# Patient Record
Sex: Female | Born: 1955
Health system: Southern US, Community
[De-identification: ages and names within clinical notes are randomized; demographics above are authoritative.]

## PROBLEM LIST (undated history)

## (undated) DIAGNOSIS — Z9889 Other specified postprocedural states: Secondary | ICD-10-CM

## (undated) DIAGNOSIS — M1990 Unspecified osteoarthritis, unspecified site: Secondary | ICD-10-CM

## (undated) DIAGNOSIS — I10 Essential (primary) hypertension: Secondary | ICD-10-CM

## (undated) DIAGNOSIS — Z8719 Personal history of other diseases of the digestive system: Secondary | ICD-10-CM

## (undated) DIAGNOSIS — R112 Nausea with vomiting, unspecified: Secondary | ICD-10-CM

## (undated) HISTORY — PX: CHOLECYSTECTOMY: SHX55

## (undated) HISTORY — PX: COLONOSCOPY: SHX174

## (undated) HISTORY — PX: DILATION AND CURETTAGE OF UTERUS: SHX78

---

## 2016-01-26 MED FILL — DICLOFENAC SOD 75 MG TAB EC: 75 | 90 days supply | Qty: 180 | Fill #0

## 2016-02-08 MED FILL — metFORMIN HCL 500 MG TABS: 500 | 90 days supply | Qty: 180 | Fill #0

## 2016-02-08 MED FILL — LOSARTAN POTASSIUM 50 MG TA: 50 | 90 days supply | Qty: 180 | Fill #0

## 2016-02-10 DIAGNOSIS — I10 Essential (primary) hypertension: Secondary | ICD-10-CM | POA: Diagnosis not present

## 2016-04-30 MED FILL — metFORMIN HCL 500 MG TABS: 500 | 90 days supply | Qty: 180 | Fill #1

## 2016-04-30 MED FILL — LOSARTAN POTASSIUM 50 MG TA: 50 | 90 days supply | Qty: 180 | Fill #1

## 2016-07-23 MED FILL — metFORMIN HCL 500 MG TABS: 500 | 60 days supply | Qty: 120 | Fill #2

## 2016-07-23 MED FILL — LOSARTAN POTASSIUM 50 MG TA: 50 | 90 days supply | Qty: 180 | Fill #2

## 2016-10-15 MED FILL — DICLOFENAC SOD 75 MG TAB EC: 75 | 90 days supply | Qty: 180 | Fill #1

## 2016-10-15 MED FILL — LOSARTAN POTASSIUM 50 MG TA: 50 | 60 days supply | Qty: 120 | Fill #3

## 2016-12-31 DIAGNOSIS — Z1231 Encounter for screening mammogram for malignant neoplasm of breast: Secondary | ICD-10-CM | POA: Diagnosis not present

## 2017-01-11 DIAGNOSIS — Z6827 Body mass index (BMI) 27.0-27.9, adult: Secondary | ICD-10-CM | POA: Diagnosis not present

## 2017-01-11 DIAGNOSIS — M7070 Other bursitis of hip, unspecified hip: Secondary | ICD-10-CM | POA: Diagnosis not present

## 2017-01-11 DIAGNOSIS — L678 Other hair color and hair shaft abnormalities: Secondary | ICD-10-CM | POA: Diagnosis not present

## 2017-01-11 DIAGNOSIS — I10 Essential (primary) hypertension: Secondary | ICD-10-CM | POA: Diagnosis not present

## 2017-01-11 MED FILL — DICLOFENAC SODIUM 75 MG TAB: 75 | 90 days supply | Qty: 180 | Fill #0

## 2017-01-11 MED FILL — LOSARTAN POTASSIUM 50 MG TA: 50 | 90 days supply | Qty: 180 | Fill #0

## 2017-01-11 MED FILL — DICLOFENAC SODIUM 1% GEL: 1 | 12 days supply | Qty: 100 | Fill #0

## 2017-01-11 MED FILL — metFORMIN HCL 500 MG TABS: 500 | 90 days supply | Qty: 180 | Fill #0

## 2017-01-16 DIAGNOSIS — R928 Other abnormal and inconclusive findings on diagnostic imaging of breast: Secondary | ICD-10-CM | POA: Diagnosis not present

## 2017-01-16 DIAGNOSIS — R922 Inconclusive mammogram: Secondary | ICD-10-CM | POA: Diagnosis not present

## 2017-01-16 DIAGNOSIS — Z803 Family history of malignant neoplasm of breast: Secondary | ICD-10-CM | POA: Diagnosis not present

## 2017-01-18 DIAGNOSIS — I10 Essential (primary) hypertension: Secondary | ICD-10-CM | POA: Diagnosis not present

## 2017-01-21 DIAGNOSIS — R928 Other abnormal and inconclusive findings on diagnostic imaging of breast: Secondary | ICD-10-CM | POA: Diagnosis not present

## 2017-01-21 DIAGNOSIS — Z6827 Body mass index (BMI) 27.0-27.9, adult: Secondary | ICD-10-CM | POA: Diagnosis not present

## 2017-01-28 DIAGNOSIS — N6321 Unspecified lump in the left breast, upper outer quadrant: Secondary | ICD-10-CM | POA: Diagnosis not present

## 2017-01-28 DIAGNOSIS — R928 Other abnormal and inconclusive findings on diagnostic imaging of breast: Secondary | ICD-10-CM | POA: Diagnosis not present

## 2017-04-05 MED FILL — LOSARTAN POTASSIUM 50 MG TA: 50 | 90 days supply | Qty: 180 | Fill #1

## 2017-04-05 MED FILL — metFORMIN HCL 500 MG TABS: 500 | 90 days supply | Qty: 180 | Fill #1

## 2017-05-20 MED FILL — DICLOFENAC SODIUM 1% GEL: 1 | 12 days supply | Qty: 100 | Fill #1

## 2017-06-04 DIAGNOSIS — Z6828 Body mass index (BMI) 28.0-28.9, adult: Secondary | ICD-10-CM | POA: Diagnosis not present

## 2017-06-04 DIAGNOSIS — I10 Essential (primary) hypertension: Secondary | ICD-10-CM | POA: Diagnosis not present

## 2017-06-04 DIAGNOSIS — E7800 Pure hypercholesterolemia, unspecified: Secondary | ICD-10-CM | POA: Diagnosis not present

## 2017-06-04 DIAGNOSIS — L678 Other hair color and hair shaft abnormalities: Secondary | ICD-10-CM | POA: Diagnosis not present

## 2017-06-04 DIAGNOSIS — Z0000 Encounter for general adult medical examination without abnormal findings: Secondary | ICD-10-CM | POA: Diagnosis not present

## 2017-06-04 MED FILL — EZETIMIBE 10 MG TABLET: 10 | 90 days supply | Qty: 90 | Fill #0

## 2017-07-09 MED FILL — LOSARTAN POTASSIUM 50 MG TA: 50 | 90 days supply | Qty: 180 | Fill #2

## 2017-08-20 MED FILL — AZITHROMYCIN 250 MG TABLET: 250 | 5 days supply | Qty: 6 | Fill #0

## 2017-08-23 MED FILL — metFORMIN HCL 500 MG TABS: 500 | 90 days supply | Qty: 180 | Fill #2

## 2017-08-23 MED FILL — EZETIMIBE 10 MG TABLET: 10 | 90 days supply | Qty: 90 | Fill #1

## 2017-09-30 MED FILL — LOSARTAN POTASSIUM 50 MG TA: 50 | 90 days supply | Qty: 180 | Fill #3

## 2017-10-04 DIAGNOSIS — Z0000 Encounter for general adult medical examination without abnormal findings: Secondary | ICD-10-CM | POA: Diagnosis not present

## 2017-11-11 MED FILL — EZETIMIBE 10 MG TABLET: 10 | 90 days supply | Qty: 90 | Fill #2

## 2017-12-30 MED FILL — metFORMIN HCL 500 MG TABS: 500 | 90 days supply | Qty: 180 | Fill #3

## 2017-12-30 MED FILL — LOSARTAN POTASSIUM 50 MG TA: 50 | 90 days supply | Qty: 180 | Fill #0

## 2018-01-06 DIAGNOSIS — Z1231 Encounter for screening mammogram for malignant neoplasm of breast: Secondary | ICD-10-CM | POA: Diagnosis not present

## 2018-02-12 MED FILL — EZETIMIBE 10 MG TABS: 10 | 90 days supply | Qty: 90 | Fill #3

## 2018-03-03 MED FILL — GAVILYTE-G SOLUTION: 236 | 1 days supply | Qty: 4000 | Fill #0

## 2018-03-19 MED FILL — DULoxetine HCL 20 MG CPEP: 20 | 90 days supply | Qty: 90 | Fill #0

## 2018-04-04 DIAGNOSIS — E785 Hyperlipidemia, unspecified: Secondary | ICD-10-CM | POA: Diagnosis not present

## 2018-04-04 DIAGNOSIS — K648 Other hemorrhoids: Secondary | ICD-10-CM | POA: Diagnosis not present

## 2018-04-04 DIAGNOSIS — D122 Benign neoplasm of ascending colon: Secondary | ICD-10-CM | POA: Diagnosis not present

## 2018-04-04 DIAGNOSIS — K635 Polyp of colon: Secondary | ICD-10-CM | POA: Diagnosis not present

## 2018-04-04 DIAGNOSIS — K5730 Diverticulosis of large intestine without perforation or abscess without bleeding: Secondary | ICD-10-CM | POA: Diagnosis not present

## 2018-04-04 DIAGNOSIS — Z1211 Encounter for screening for malignant neoplasm of colon: Secondary | ICD-10-CM | POA: Diagnosis not present

## 2018-04-04 DIAGNOSIS — Z87891 Personal history of nicotine dependence: Secondary | ICD-10-CM | POA: Diagnosis not present

## 2018-04-04 DIAGNOSIS — K449 Diaphragmatic hernia without obstruction or gangrene: Secondary | ICD-10-CM | POA: Diagnosis not present

## 2018-04-04 DIAGNOSIS — Z86010 Personal history of colonic polyps: Secondary | ICD-10-CM | POA: Diagnosis not present

## 2018-04-04 DIAGNOSIS — K219 Gastro-esophageal reflux disease without esophagitis: Secondary | ICD-10-CM | POA: Diagnosis not present

## 2018-04-28 MED FILL — LOSARTAN POTASSIUM 50 MG TA: 50 | 90 days supply | Qty: 180 | Fill #0

## 2018-05-01 MED FILL — metFORMIN HCL 500 MG TABS: 500 | 30 days supply | Qty: 60 | Fill #0

## 2018-05-22 MED FILL — DULOXETINE HCL 20 MG CPEP: 20 | 90 days supply | Qty: 180 | Fill #0

## 2018-05-22 MED FILL — metFORMIN HCL 500 MG TABS: 500 | 90 days supply | Qty: 180 | Fill #0

## 2018-05-22 MED FILL — DICLOFENAC SODIUM 1 % GEL: 1 | 12 days supply | Qty: 100 | Fill #0

## 2018-05-28 MED FILL — EZETIMIBE 10 MG TABS: 10 | 90 days supply | Qty: 90 | Fill #0

## 2018-06-16 DIAGNOSIS — I10 Essential (primary) hypertension: Secondary | ICD-10-CM | POA: Diagnosis not present

## 2018-06-16 DIAGNOSIS — E7800 Pure hypercholesterolemia, unspecified: Secondary | ICD-10-CM | POA: Diagnosis not present

## 2018-07-09 MED FILL — LOSARTAN POTASSIUM 50 MG TA: 50 | 30 days supply | Qty: 60 | Fill #0

## 2018-07-10 DIAGNOSIS — M160 Bilateral primary osteoarthritis of hip: Secondary | ICD-10-CM | POA: Diagnosis not present

## 2018-07-10 DIAGNOSIS — Z0000 Encounter for general adult medical examination without abnormal findings: Secondary | ICD-10-CM | POA: Diagnosis not present

## 2018-07-10 DIAGNOSIS — M7070 Other bursitis of hip, unspecified hip: Secondary | ICD-10-CM | POA: Diagnosis not present

## 2018-07-10 DIAGNOSIS — I10 Essential (primary) hypertension: Secondary | ICD-10-CM | POA: Diagnosis not present

## 2018-07-10 DIAGNOSIS — Z6827 Body mass index (BMI) 27.0-27.9, adult: Secondary | ICD-10-CM | POA: Diagnosis not present

## 2018-07-10 DIAGNOSIS — E7800 Pure hypercholesterolemia, unspecified: Secondary | ICD-10-CM | POA: Diagnosis not present

## 2018-07-10 DIAGNOSIS — L678 Other hair color and hair shaft abnormalities: Secondary | ICD-10-CM | POA: Diagnosis not present

## 2018-08-14 MED FILL — DULoxetine HCL 20 MG CPEP: 20 | 90 days supply | Qty: 180 | Fill #1

## 2018-08-14 MED FILL — metFORMIN HCL 500 MG TABS: 500 | 90 days supply | Qty: 180 | Fill #1

## 2018-08-14 MED FILL — LOSARTAN POTASSIUM 50 MG TA: 50 | 30 days supply | Qty: 60 | Fill #1

## 2018-08-14 MED FILL — EZETIMIBE 10 MG TABS: 10 | 90 days supply | Qty: 90 | Fill #0

## 2018-10-08 MED FILL — LOSARTAN POTASSIUM 50 MG TA: 50 | 30 days supply | Qty: 60 | Fill #2

## 2018-11-08 MED FILL — LOSARTAN POTASSIUM 50 MG TA: 50 | 30 days supply | Qty: 60 | Fill #3

## 2018-11-08 MED FILL — EZETIMIBE 10 MG TABS: 10 | 90 days supply | Qty: 90 | Fill #1

## 2018-11-10 ENCOUNTER — Ambulatory Visit (INDEPENDENT_AMBULATORY_CARE_PROVIDER_SITE_OTHER): Payer: 59

## 2018-11-10 ENCOUNTER — Ambulatory Visit (INDEPENDENT_AMBULATORY_CARE_PROVIDER_SITE_OTHER): Payer: 59 | Admitting: Orthopaedic Surgery

## 2018-11-10 VITALS — Ht 65.0 in | Wt 165.0 lb

## 2018-11-10 DIAGNOSIS — M25551 Pain in right hip: Secondary | ICD-10-CM | POA: Diagnosis not present

## 2018-11-10 DIAGNOSIS — M1611 Unilateral primary osteoarthritis, right hip: Secondary | ICD-10-CM | POA: Diagnosis not present

## 2018-11-10 NOTE — Progress Notes (Signed)
Office Visit Note   Patient: Alyssa Hopkins           Date of Birth: 27-Apr-1955           MRN: [id number] Visit Date: 11/10/2018              Requested by: No referring provider defined for this encounter. PCP: Kingsley Callander, MD   Assessment & Plan: Visit Diagnoses:  1. Pain in right hip   2. Unilateral primary osteoarthritis, right hip     Plan: At this point she is tried and exhausted all forms of conservative treatment.  1 option would be a hip replacement.  I agree that she needs this at some point when she feels ready given the severity of arthritis on x-rays and the pain she is experiencing on a daily basis.  Also on clinical exam her right hip is quite stiff.  I spent some time with her showing her hip model.  We went over x-rays and described in detail what the surgery involves from her interoperative and postoperative course.  We talked about the risk and benefits of surgery as well and I gave her handout about hip replacement surgery through an anterior approach.  All question concerns were answered and addressed.  She knows to come off anti-inflammatories about 6 to 7 days preoperative.  I gave her our surgery scheduler's card.  She said she will give [place] a call when she feels ready.  She is also welcome to message me through my chart.  Follow-Up Instructions: Return if symptoms worsen or fail to improve.   Orders:  Orders Placed This Encounter  Procedures   XR HIP UNILAT W OR W/O PELVIS 1V RIGHT   No orders of the defined types were placed in this encounter.     Procedures: No procedures performed   Clinical Data: No additional findings.   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  This is been slowly getting worse for several years now.  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  She also has a mother who has had a hip replacement and a brother who is had a hip replacement.  She does have groin pain.  She says she is able to do with it as long as she is on anti-inflammatories.  The pain is been slowly worsening with time.  It hurts with her actives daily living.  At this point her right hip pain is definitely affecting her mobility, her quality of life and actives daily living.  She works quite regularly as a Designer, jewellery.  She is not diabetic.  She has been dealing with this worsening for years and at this point wants to consider hip replacement surgery.  She has exhausted all forms of conservative treatment at this point.  HPI  Review of Systems She currently denies any headache, chest pain, shortness of breath, fever, chills, nausea, vomiting  Objective: Vital Signs: Ht 5\' 5"  (1.651 m)    Wt 165 lb (74.8 kg)    BMI 27.46 kg/m   Physical Exam She is alert and orient x3 and in no acute distress Ortho Exam Examination of her right hip shows pain with internal and external rotation.  There is stiffness as well.  Her left hip moves normally. Specialty Comments:  No specialty comments available.  Imaging: Xr Hip Unilat W Or W/o Pelvis 1v Right  Result Date: 11/10/2018 An AP pelvis and lateral right hip show severe end-stage arthritis of the right hip.  There is complete loss of the joint space.  There are sclerotic changes in the femoral head and the acetabulum.  There are large periarticular osteophytes around the hip.    PMFS History: Patient Active Problem List   Diagnosis Date Noted   Unilateral primary osteoarthritis, right hip 11/10/2018   No past medical history on file.  No family history on file.   Social History   Occupational History   Not on file  Tobacco Use   Smoking status: Not on file  Substance and Sexual Activity   Alcohol use: Not on file   Drug use: Not on file   Sexual activity: Not on file

## 2018-11-13 ENCOUNTER — Encounter: Payer: Self-pay | Admitting: Orthopaedic Surgery

## 2018-12-17 MED FILL — LOSARTAN POTASSIUM 50 MG TA: 50 | 30 days supply | Qty: 60 | Fill #4

## 2019-01-11 MED FILL — metFORMIN HCL 500 MG TABS: 500 | 90 days supply | Qty: 180 | Fill #2

## 2019-01-12 MED FILL — LOSARTAN POTASSIUM 50 MG TA: 50 | 30 days supply | Qty: 60 | Fill #5

## 2019-02-02 DIAGNOSIS — R921 Mammographic calcification found on diagnostic imaging of breast: Secondary | ICD-10-CM | POA: Diagnosis not present

## 2019-02-02 DIAGNOSIS — R922 Inconclusive mammogram: Secondary | ICD-10-CM | POA: Diagnosis not present

## 2019-02-02 DIAGNOSIS — R928 Other abnormal and inconclusive findings on diagnostic imaging of breast: Secondary | ICD-10-CM | POA: Diagnosis not present

## 2019-02-02 DIAGNOSIS — Z1231 Encounter for screening mammogram for malignant neoplasm of breast: Secondary | ICD-10-CM | POA: Diagnosis not present

## 2019-02-05 ENCOUNTER — Encounter: Payer: Self-pay | Admitting: Orthopaedic Surgery

## 2019-02-05 MED FILL — EZETIMIBE 10 MG TABS: 10 | 90 days supply | Qty: 90 | Fill #2

## 2019-02-07 MED FILL — LOSARTAN POTASSIUM 50 MG TA: 50 | 30 days supply | Qty: 60 | Fill #6

## 2019-02-12 ENCOUNTER — Encounter: Payer: Self-pay | Admitting: Orthopaedic Surgery

## 2019-03-05 ENCOUNTER — Encounter: Payer: Self-pay | Admitting: Orthopaedic Surgery

## 2019-03-06 ENCOUNTER — Telehealth: Payer: Self-pay

## 2019-03-06 ENCOUNTER — Other Ambulatory Visit: Payer: Self-pay

## 2019-03-06 NOTE — Telephone Encounter (Signed)
Faxed completed form to [employer] [phone] Also emailed copy to patient.

## 2019-03-10 ENCOUNTER — Other Ambulatory Visit: Payer: Self-pay | Admitting: Physician Assistant

## 2019-03-14 MED FILL — LOSARTAN POTASSIUM 50 MG TA: 50 | 30 days supply | Qty: 60 | Fill #7

## 2019-03-19 NOTE — Progress Notes (Addendum)
[place], [place] - [address] [place] [address] Phone: [phone] Fax: [phone]  [place], [place] - [address] [place] [address] Phone: [phone] Fax: [phone]  3   Your procedure is scheduled on Tuesday Mar 24, 2019.  Report to [facility] Main Entrance "A" at 10:00 A.M., and check in at the Admitting office.  Call this number if you have problems the morning of surgery:  [phone]   Call [phone] if you have any questions prior to your surgery date Monday-Friday 8am-4pm    Remember:  Do not eat after midnight the night before your surgery  You may drink clear liquids until 09:00 A.M the morning of your surgery.   Clear liquids allowed are: Water, Non-Citrus Juices (without pulp), Carbonated Beverages, Clear Tea, Black Coffee Only, and Gatorade  Please complete your PRE-SURGERY ENSURE that was provided to you by 09:00 A.M. the morning of surgery.  Please, if able, drink it in one setting. DO NOT SIP    Take these medicines the morning of surgery with A SIP OF WATER  acetaminophen (TYLENOL) ezetimibe (ZETIA)  7 days prior to surgery STOP taking any Aspirin (unless otherwise instructed by your surgeon), Aleve, Naproxen, Ibuprofen, Motrin, Advil, Goody's, BC's, all herbal medications, fish oil, and all vitamins.    The Morning of Surgery  Do not wear jewelry, make-up or nail polish.  Do not wear lotions, powders, perfumes, or deodorant  Do not shave 48 hours prior to surgery.   Do not bring valuables to the hospital.  [facility] is not responsible for any belongings or valuables.  If you are a smoker, DO NOT Smoke 24 hours prior to surgery  If you wear a CPAP at night please bring your mask the morning of surgery   Remember that you must have someone to transport you home after your surgery, and remain with you for  24 hours if you are discharged the same day.   Please bring cases for contacts, glasses, hearing aids, dentures or bridgework because it cannot be worn into surgery.    Leave your suitcase in the car.  After surgery it may be brought to your room.  For patients admitted to the hospital, discharge time will be determined by your treatment team.  Patients discharged the day of surgery will not be allowed to drive home.    Special instructions:   [facility]- Preparing For Surgery  Before surgery, you can play an important role. Because skin is not sterile, your skin needs to be as free of germs as possible. You can reduce the number of germs on your skin by washing with CHG (chlorahexidine gluconate) Soap before surgery.  CHG is an antiseptic cleaner which kills germs and bonds with the skin to continue killing germs even after washing.    Oral Hygiene is also important to reduce your risk of infection.  Remember - BRUSH YOUR TEETH THE MORNING OF SURGERY WITH YOUR REGULAR TOOTHPASTE  Please do not use if you have an allergy to CHG or antibacterial soaps. If your skin becomes reddened/irritated stop using the CHG.  Do not shave (including legs and underarms) for at least 48 hours prior to first CHG shower. It is OK to shave your face.  Please follow these instructions carefully.   1. Shower the NIGHT BEFORE SURGERY and the MORNING OF SURGERY with CHG Soap.  2. If you chose to wash your hair, wash your hair first as usual with your normal shampoo.  3. After you shampoo, rinse your hair and body thoroughly to remove the shampoo.  4. Use CHG as you would any other liquid soap. You can apply CHG directly to the skin and wash gently with a scrungie or a clean washcloth.   5. Apply the CHG Soap to your body ONLY FROM THE NECK DOWN.  Do not use on open wounds or open sores. Avoid contact with your eyes, ears, mouth and genitals (private parts). Wash Face and genitals (private parts)  with  your normal soap.   6. Wash thoroughly, paying special attention to the area where your surgery will be performed.  7. Thoroughly rinse your body with warm water from the neck down.  8. DO NOT shower/wash with your normal soap after using and rinsing off the CHG Soap.  9. Pat yourself dry with a CLEAN TOWEL.  10. Wear CLEAN PAJAMAS to bed the night before surgery, wear comfortable clothes the morning of surgery  11. Place CLEAN SHEETS on your bed the night of your first shower and DO NOT SLEEP WITH PETS.    Day of Surgery:  Please shower the morning of surgery with the CHG soap Do not apply any deodorants/lotions. Please wear clean clothes to the hospital/surgery center.   Remember to brush your teeth WITH YOUR REGULAR TOOTHPASTE.   Please read over the following fact sheets that you were given.

## 2019-03-20 ENCOUNTER — Other Ambulatory Visit: Payer: Self-pay

## 2019-03-20 ENCOUNTER — Encounter (HOSPITAL_COMMUNITY)
Admission: RE | Admit: 2019-03-20 | Discharge: 2019-03-20 | Disposition: A | Discharge status: Home / Self Care | Payer: 59 | Source: Ambulatory Visit | Attending: Orthopaedic Surgery | Admitting: Orthopaedic Surgery

## 2019-03-20 ENCOUNTER — Other Ambulatory Visit (HOSPITAL_COMMUNITY)
Admission: RE | Admit: 2019-03-20 | Discharge: 2019-03-20 | Disposition: A | Discharge status: Home / Self Care | Payer: 59 | Source: Ambulatory Visit | Attending: Orthopaedic Surgery | Admitting: Orthopaedic Surgery

## 2019-03-20 ENCOUNTER — Encounter (HOSPITAL_COMMUNITY): Payer: Self-pay

## 2019-03-20 DIAGNOSIS — Z20822 Contact with and (suspected) exposure to covid-19: Secondary | ICD-10-CM | POA: Diagnosis not present

## 2019-03-20 DIAGNOSIS — R9431 Abnormal electrocardiogram [ECG] [EKG]: Secondary | ICD-10-CM | POA: Insufficient documentation

## 2019-03-20 DIAGNOSIS — Z01812 Encounter for preprocedural laboratory examination: Secondary | ICD-10-CM | POA: Insufficient documentation

## 2019-03-20 HISTORY — DX: Other specified postprocedural states: Z98.890

## 2019-03-20 HISTORY — DX: Essential (primary) hypertension: I10

## 2019-03-20 HISTORY — DX: Other specified postprocedural states: R11.2

## 2019-03-20 HISTORY — DX: Personal history of other diseases of the digestive system: Z87.19

## 2019-03-20 HISTORY — DX: Unspecified osteoarthritis, unspecified site: M19.90

## 2019-03-20 LAB — CBC
HCT: 41.1 % (ref 36.0–46.0)
Hemoglobin: 13.4 g/dL (ref 12.0–15.0)
MCH: 29.6 pg (ref 26.0–34.0)
MCHC: 32.6 g/dL (ref 30.0–36.0)
MCV: 90.7 fL (ref 80.0–100.0)
Platelets: 247 10*3/uL (ref 150–400)
RBC: 4.53 MIL/uL (ref 3.87–5.11)
RDW: 13.2 % (ref 11.5–15.5)
WBC: 6.9 10*3/uL (ref 4.0–10.5)
nRBC: 0 % (ref 0.0–0.2)

## 2019-03-20 LAB — BASIC METABOLIC PANEL
Anion gap: 10 (ref 5–15)
BUN: 11 mg/dL (ref 8–23)
CO2: 26 mmol/L (ref 22–32)
Calcium: 10.2 mg/dL (ref 8.9–10.3)
Chloride: 105 mmol/L (ref 98–111)
Creatinine, Ser: 0.64 mg/dL (ref 0.44–1.00)
GFR calc Af Amer: >60 mL/min (ref >60)
GFR calc non Af Amer: >60 mL/min (ref >60)
Glucose, Bld: 101 mg/dL — ABNORMAL HIGH (ref 70–99)
Potassium: 4 mmol/L (ref 3.5–5.1)
Sodium: 141 mmol/L (ref 135–145)

## 2019-03-20 LAB — SURGICAL PCR SCREEN
MRSA, PCR: NEGATIVE
Staphylococcus aureus: NEGATIVE

## 2019-03-20 LAB — SARS CORONAVIRUS 2 (TAT 6-24 HRS): SARS Coronavirus 2: NEGATIVE

## 2019-03-20 NOTE — Anesthesia Preprocedure Evaluation (Addendum)
Anesthesia Evaluation  Patient identified by MRN, date of birth, ID band Patient awake    Reviewed: Allergy & Precautions, NPO status , Patient's Chart, lab work & pertinent test results  History of Anesthesia Complications (+) PONV and history of anesthetic complications  Airway Mallampati: II  TM Distance: >3 FB Neck ROM: Full    Dental no notable dental hx. (+) Teeth Intact, Dental Advisory Given   Pulmonary neg pulmonary ROS, former smoker,    Pulmonary exam normal breath sounds clear to auscultation       Cardiovascular hypertension, Pt. on medications negative cardio ROS Normal cardiovascular exam Rhythm:Regular Rate:Normal     Neuro/Psych negative neurological ROS  negative psych ROS   GI/Hepatic Neg liver ROS, hiatal hernia,   Endo/Other  negative endocrine ROS  Renal/GU negative Renal ROS  negative genitourinary   Musculoskeletal  (+) Arthritis , Osteoarthritis,  Right hip OA   Abdominal   Peds negative pediatric ROS (+)  Hematology negative hematology ROS (+) plt 247, hct 41   Anesthesia Other Findings   Reproductive/Obstetrics negative OB ROS                            Anesthesia Physical Anesthesia Plan  ASA: II  Anesthesia Plan: Spinal   Post-op Pain Management:    Induction:   PONV Risk Score and Plan: 2 and Propofol infusion and TIVA  Airway Management Planned: Natural Airway and Nasal Cannula  Additional Equipment: None  Intra-op Plan:   Post-operative Plan:   Informed Consent: I have reviewed the patients History and Physical, chart, labs and discussed the procedure including the risks, benefits and alternatives for the proposed anesthesia with the patient or authorized representative who has indicated his/her understanding and acceptance.       Plan Discussed with: CRNA  Anesthesia Plan Comments:        Anesthesia Quick Evaluation

## 2019-03-20 NOTE — Progress Notes (Signed)
PCP -  Kingsley Callander, MD Cardiologist - Denies  PPM/ICD - Denies  Chest x-ray - N/A EKG - 03/20/19 Stress Test - Denies ECHO - Denies Cardiac Cath - Denies  Sleep Study - Denies   Patient denies being a diabetic  Blood Thinner Instructions: N/A Aspirin Instructions:N/A  ERAS Protcol - Yes PRE-SURGERY Ensure or G2- Ensure given  COVID TEST- 03/20/19   Anesthesia review: Abnormal EKG  Patient denies shortness of breath, fever, cough and chest pain at PAT appointment   All instructions explained to the patient, with a verbal understanding of the material. Patient agrees to go over the instructions while at home for a better understanding. Patient also instructed to self quarantine after being tested for COVID-19. The opportunity to ask questions was provided.

## 2019-03-20 NOTE — Progress Notes (Signed)
Elevated BP during PAT visit  Patient's BP elevated during PAT visit. Patient stated, "This is the best blood pressure you may get". Patient stated she already took her BP medication this morning. Further, she is in tremendous pain in her right hip.    03/20/19 0900 03/20/19 0908  Vitals  Temp 99 F (37.2 C)  --   Temp Source Oral  --   Pulse Rate 78  --   Pulse Rate Source Dinamap  --   Resp 18  --   BP (!) 141/92  --   SpO2 100 %  --   Pain Assessment  Pain Scale  --  0-10  Pain Score  --  10  Pain Type  --  Chronic pain  Pain Location  --  Hip  Pain Orientation  --  Right  Pain Descriptors / Indicators  --  Constant;Stabbing  Pain Onset  --  On-going  Patients Stated Pain Goal  --  5  Pain Intervention(s)  --  RN made aware

## 2019-03-20 NOTE — Progress Notes (Signed)
Anesthesia Chart Review:  Case: [id number] Date/Time: 03/24/19 1145   Procedure: RIGHT TOTAL HIP ARTHROPLASTY ANTERIOR APPROACH (Right Hip)   Anesthesia type: Choice   Pre-op diagnosis: Right hip osteoarthritis   Location: [facility] OR ROOM 04 / [facility]   Surgeons: Mcarthur Rossetti, MD      DISCUSSION: Patient is a 64 year old female scheduled for the above procedure.  History includes former smoker, post-operative N/V, HTN, hiatal hernia.  She denied chest pain, SOB, cough, fever at PAT RN visit. 03/20/19 presurgical COVID-19 test is in process.   VS: BP (!) 141/92   Pulse 78   Temp 37.2 C (Oral)   Resp 18   Ht 5\' 5"  (1.651 m)   Wt 75 kg   SpO2 100%   BMI 27.50 kg/m   PROVIDERS: Barnhouse, Gladstone Pih, MD is PCP ([place]). Physical exam 07/10/18.    LABS: Labs reviewed: Acceptable for surgery. (all labs ordered are listed, but only abnormal results are displayed)  Labs Reviewed  BASIC METABOLIC PANEL - Abnormal; Notable for the following components:      Result Value   Glucose, Bld 101 (*)    All other components within normal limits  SURGICAL PCR SCREEN  CBC    EKG: 03/20/19:  Sinus rhythm with marked sinus arrhythmia with occasional Premature ventricular complexes Nonspecific ST and T wave abnormality Abnormal ECG   CV: N/A   Past Medical History:  Diagnosis Date  . Arthritis   . History of hiatal hernia   . Hypertension   . PONV (postoperative nausea and vomiting)     Past Surgical History:  Procedure Laterality Date  . CHOLECYSTECTOMY    . COLONOSCOPY    . DILATION AND CURETTAGE OF UTERUS      MEDICATIONS: . acetaminophen (TYLENOL) 650 MG CR tablet  . ascorbic acid (VITAMIN C) 500 MG tablet  . Calcium Carb-Cholecalciferol (CALCIUM/VITAMIN D PO)  . diclofenac (VOLTAREN) 75 MG EC tablet  . ezetimibe (ZETIA) 10 MG tablet  . losartan (COZAAR) 100 MG tablet  . Multiple Vitamin (MULTI-VITAMIN) tablet  . TURMERIC PO   No current  facility-administered medications for this encounter.    Myra Gianotti, PA-C Surgical Short Stay/Anesthesiology [facility] Phone [phone] [facility] Phone [phone] 03/20/2019 1:05 PM

## 2019-03-24 ENCOUNTER — Inpatient Hospital Stay (HOSPITAL_COMMUNITY): Payer: 59 | Admitting: Anesthesiology

## 2019-03-24 ENCOUNTER — Encounter (HOSPITAL_COMMUNITY): Payer: Self-pay | Admitting: Orthopaedic Surgery

## 2019-03-24 ENCOUNTER — Encounter (HOSPITAL_COMMUNITY)
Admission: RE | Disposition: A | Discharge status: Home Health | Payer: Self-pay | Source: Home / Self Care | Attending: Orthopaedic Surgery

## 2019-03-24 ENCOUNTER — Inpatient Hospital Stay (HOSPITAL_COMMUNITY): Payer: 59 | Admitting: Vascular Surgery

## 2019-03-24 ENCOUNTER — Other Ambulatory Visit: Payer: Self-pay

## 2019-03-24 ENCOUNTER — Inpatient Hospital Stay (HOSPITAL_COMMUNITY): Payer: 59

## 2019-03-24 ENCOUNTER — Inpatient Hospital Stay: Admit: 2019-03-24 | Payer: 59 | Admitting: Orthopaedic Surgery

## 2019-03-24 ENCOUNTER — Inpatient Hospital Stay (HOSPITAL_COMMUNITY)
Admission: RE | Admit: 2019-03-24 | Discharge: 2019-03-25 | DRG: 470 | Disposition: A | Discharge status: Home Health | Payer: 59 | Attending: Orthopaedic Surgery | Admitting: Orthopaedic Surgery

## 2019-03-24 DIAGNOSIS — M1611 Unilateral primary osteoarthritis, right hip: Secondary | ICD-10-CM | POA: Diagnosis present

## 2019-03-24 DIAGNOSIS — I10 Essential (primary) hypertension: Secondary | ICD-10-CM | POA: Diagnosis present

## 2019-03-24 DIAGNOSIS — Z79899 Other long term (current) drug therapy: Secondary | ICD-10-CM | POA: Diagnosis not present

## 2019-03-24 DIAGNOSIS — Z419 Encounter for procedure for purposes other than remedying health state, unspecified: Secondary | ICD-10-CM

## 2019-03-24 DIAGNOSIS — Z20822 Contact with and (suspected) exposure to covid-19: Secondary | ICD-10-CM | POA: Diagnosis present

## 2019-03-24 DIAGNOSIS — Z87891 Personal history of nicotine dependence: Secondary | ICD-10-CM | POA: Diagnosis not present

## 2019-03-24 DIAGNOSIS — Z471 Aftercare following joint replacement surgery: Secondary | ICD-10-CM | POA: Diagnosis not present

## 2019-03-24 DIAGNOSIS — Z96641 Presence of right artificial hip joint: Secondary | ICD-10-CM | POA: Diagnosis not present

## 2019-03-24 HISTORY — PX: TOTAL HIP ARTHROPLASTY: SHX124

## 2019-03-24 SURGERY — ARTHROPLASTY, HIP, TOTAL, ANTERIOR APPROACH
Anesthesia: General | Site: Hip | Laterality: Right

## 2019-03-24 SURGERY — ARTHROPLASTY, HIP, TOTAL, ANTERIOR APPROACH
Anesthesia: Choice | Site: Hip | Laterality: Right

## 2019-03-24 MED ORDER — SODIUM CHLORIDE 0.9 % IV SOLN: INTRAVENOUS | Status: DC

## 2019-03-24 MED ORDER — ONDANSETRON HCL 4 MG/2ML IJ SOLN
INTRAMUSCULAR | Status: AC
  Filled 2019-03-24: qty 2

## 2019-03-24 MED ORDER — MIDAZOLAM HCL 5 MG/5ML IJ SOLN
INTRAMUSCULAR | Status: DC | PRN
  Administered 2019-03-24 (×2): 1 mg via INTRAVENOUS

## 2019-03-24 MED ORDER — MENTHOL 3 MG MT LOZG: 1.0000 | LOZENGE | OROMUCOSAL | Status: DC | PRN

## 2019-03-24 MED ORDER — BUPIVACAINE IN DEXTROSE 0.75-8.25 % IT SOLN
INTRATHECAL | Status: DC | PRN
  Administered 2019-03-24: 1.6 mL via INTRATHECAL

## 2019-03-24 MED ORDER — LIDOCAINE 2% (20 MG/ML) 5 ML SYRINGE
INTRAMUSCULAR | Status: DC | PRN
  Administered 2019-03-24: 40 mg via INTRAVENOUS

## 2019-03-24 MED ORDER — OXYCODONE HCL 5 MG PO TABS
5.0000 mg | ORAL_TABLET | ORAL | Status: DC | PRN
  Administered 2019-03-24 – 2019-03-25 (×2): 10 mg via ORAL
  Administered 2019-03-25: 5 mg via ORAL
  Administered 2019-03-25: 10 mg via ORAL
  Filled 2019-03-24: qty 2
  Filled 2019-03-24: qty 1
  Filled 2019-03-24 (×2): qty 2

## 2019-03-24 MED ORDER — PHENYLEPHRINE HCL-NACL 10-0.9 MG/250ML-% IV SOLN
INTRAVENOUS | Status: DC | PRN
  Administered 2019-03-24: 40 ug/min via INTRAVENOUS

## 2019-03-24 MED ORDER — HYDROMORPHONE HCL 1 MG/ML IJ SOLN
0.2500 mg | INTRAMUSCULAR | Status: DC | PRN
  Administered 2019-03-24 (×2): 0.5 mg via INTRAVENOUS

## 2019-03-24 MED ORDER — POVIDONE-IODINE 10 % EX SWAB: 2.0000 "application " | Freq: Once | CUTANEOUS | Status: DC

## 2019-03-24 MED ORDER — KETOROLAC TROMETHAMINE 15 MG/ML IJ SOLN
7.5000 mg | Freq: Four times a day (QID) | INTRAMUSCULAR | Status: AC
  Administered 2019-03-24 – 2019-03-25 (×4): 7.5 mg via INTRAVENOUS
  Filled 2019-03-24 (×4): qty 1

## 2019-03-24 MED ORDER — PHENYLEPHRINE 40 MCG/ML (10ML) SYRINGE FOR IV PUSH (FOR BLOOD PRESSURE SUPPORT)
PREFILLED_SYRINGE | INTRAVENOUS | Status: AC
  Filled 2019-03-24: qty 10

## 2019-03-24 MED ORDER — KETOROLAC TROMETHAMINE 30 MG/ML IJ SOLN
30.0000 mg | Freq: Once | INTRAMUSCULAR | Status: AC | PRN
  Administered 2019-03-24: 30 mg via INTRAVENOUS

## 2019-03-24 MED ORDER — OXYCODONE HCL 5 MG PO TABS
ORAL_TABLET | ORAL | Status: AC
  Filled 2019-03-24: qty 1

## 2019-03-24 MED ORDER — OXYCODONE HCL 5 MG PO TABS
10.0000 mg | ORAL_TABLET | ORAL | Status: DC | PRN
  Administered 2019-03-24 – 2019-03-25 (×2): 10 mg via ORAL
  Filled 2019-03-24 (×2): qty 2

## 2019-03-24 MED ORDER — ASPIRIN 81 MG PO CHEW
81.0000 mg | CHEWABLE_TABLET | Freq: Two times a day (BID) | ORAL | Status: DC
  Administered 2019-03-24 – 2019-03-25 (×2): 81 mg via ORAL
  Filled 2019-03-24 (×2): qty 1

## 2019-03-24 MED ORDER — FENTANYL CITRATE (PF) 100 MCG/2ML IJ SOLN
INTRAMUSCULAR | Status: DC | PRN
  Administered 2019-03-24 (×3): 50 ug via INTRAVENOUS

## 2019-03-24 MED ORDER — ACETAMINOPHEN 325 MG PO TABS
325.0000 mg | ORAL_TABLET | Freq: Four times a day (QID) | ORAL | Status: DC | PRN
  Filled 2019-03-24: qty 2

## 2019-03-24 MED ORDER — SUGAMMADEX SODIUM 200 MG/2ML IV SOLN
INTRAVENOUS | Status: DC | PRN
  Administered 2019-03-24: 200 mg via INTRAVENOUS

## 2019-03-24 MED ORDER — PANTOPRAZOLE SODIUM 40 MG PO TBEC
40.0000 mg | DELAYED_RELEASE_TABLET | Freq: Every day | ORAL | Status: DC
  Administered 2019-03-25: 40 mg via ORAL
  Filled 2019-03-24: qty 1

## 2019-03-24 MED ORDER — SCOPOLAMINE 1 MG/3DAYS TD PT72
1.0000 | MEDICATED_PATCH | TRANSDERMAL | Status: DC
  Administered 2019-03-24: 1.5 mg via TRANSDERMAL
  Filled 2019-03-24: qty 1

## 2019-03-24 MED ORDER — LIDOCAINE 2% (20 MG/ML) 5 ML SYRINGE
INTRAMUSCULAR | Status: AC
  Filled 2019-03-24: qty 5

## 2019-03-24 MED ORDER — 0.9 % SODIUM CHLORIDE (POUR BTL) OPTIME
TOPICAL | Status: DC | PRN
  Administered 2019-03-24: 13:00:00 1000 mL

## 2019-03-24 MED ORDER — FENTANYL CITRATE (PF) 250 MCG/5ML IJ SOLN
INTRAMUSCULAR | Status: AC
  Filled 2019-03-24: qty 5

## 2019-03-24 MED ORDER — KETOROLAC TROMETHAMINE 30 MG/ML IJ SOLN
INTRAMUSCULAR | Status: AC
  Filled 2019-03-24: qty 1

## 2019-03-24 MED ORDER — CEFAZOLIN SODIUM-DEXTROSE 2-4 GM/100ML-% IV SOLN
INTRAVENOUS | Status: AC
  Filled 2019-03-24: qty 100

## 2019-03-24 MED ORDER — ONDANSETRON HCL 4 MG/2ML IJ SOLN: 4.0000 mg | Freq: Four times a day (QID) | INTRAMUSCULAR | Status: DC | PRN

## 2019-03-24 MED ORDER — DEXAMETHASONE SODIUM PHOSPHATE 4 MG/ML IJ SOLN
INTRAMUSCULAR | Status: DC | PRN
  Administered 2019-03-24: 10 mg via INTRAVENOUS

## 2019-03-24 MED ORDER — ONDANSETRON HCL 4 MG PO TABS: 4.0000 mg | ORAL_TABLET | Freq: Four times a day (QID) | ORAL | Status: DC | PRN

## 2019-03-24 MED ORDER — METOCLOPRAMIDE HCL 5 MG/ML IJ SOLN: 5.0000 mg | Freq: Three times a day (TID) | INTRAMUSCULAR | Status: DC | PRN

## 2019-03-24 MED ORDER — PROPOFOL 10 MG/ML IV BOLUS
INTRAVENOUS | Status: DC | PRN
  Administered 2019-03-24: 20 mg via INTRAVENOUS
  Administered 2019-03-24: 30 mg via INTRAVENOUS
  Administered 2019-03-24: 100 mg via INTRAVENOUS

## 2019-03-24 MED ORDER — SODIUM CHLORIDE 0.9 % IR SOLN
Status: DC | PRN
  Administered 2019-03-24: 3000 mL

## 2019-03-24 MED ORDER — CEFAZOLIN SODIUM-DEXTROSE 2-4 GM/100ML-% IV SOLN
2.0000 g | INTRAVENOUS | Status: AC
  Administered 2019-03-24: 12:00:00 2 g via INTRAVENOUS

## 2019-03-24 MED ORDER — GABAPENTIN 100 MG PO CAPS
100.0000 mg | ORAL_CAPSULE | Freq: Three times a day (TID) | ORAL | Status: DC
  Administered 2019-03-24 – 2019-03-25 (×3): 100 mg via ORAL
  Filled 2019-03-24 (×3): qty 1

## 2019-03-24 MED ORDER — PHENYLEPHRINE 40 MCG/ML (10ML) SYRINGE FOR IV PUSH (FOR BLOOD PRESSURE SUPPORT)
PREFILLED_SYRINGE | INTRAVENOUS | Status: DC | PRN
  Administered 2019-03-24: 120 ug via INTRAVENOUS

## 2019-03-24 MED ORDER — DEXAMETHASONE SODIUM PHOSPHATE 10 MG/ML IJ SOLN
INTRAMUSCULAR | Status: AC
  Filled 2019-03-24: qty 1

## 2019-03-24 MED ORDER — POLYETHYLENE GLYCOL 3350 17 G PO PACK: 17.0000 g | PACK | Freq: Every day | ORAL | Status: DC | PRN

## 2019-03-24 MED ORDER — HYDROMORPHONE HCL 1 MG/ML IJ SOLN: 0.5000 mg | INTRAMUSCULAR | Status: DC | PRN

## 2019-03-24 MED ORDER — ONDANSETRON HCL 4 MG/2ML IJ SOLN
INTRAMUSCULAR | Status: DC | PRN
  Administered 2019-03-24: 4 mg via INTRAVENOUS

## 2019-03-24 MED ORDER — TRANEXAMIC ACID-NACL 1000-0.7 MG/100ML-% IV SOLN
INTRAVENOUS | Status: AC
  Filled 2019-03-24: qty 100

## 2019-03-24 MED ORDER — PROMETHAZINE HCL 25 MG/ML IJ SOLN
INTRAMUSCULAR | Status: AC
  Filled 2019-03-24: qty 1

## 2019-03-24 MED ORDER — PROMETHAZINE HCL 25 MG/ML IJ SOLN
6.2500 mg | INTRAMUSCULAR | Status: DC | PRN
  Administered 2019-03-24: 6.25 mg via INTRAVENOUS

## 2019-03-24 MED ORDER — OXYCODONE HCL 5 MG/5ML PO SOLN: 5.0000 mg | Freq: Once | ORAL | Status: AC | PRN

## 2019-03-24 MED ORDER — EZETIMIBE 10 MG PO TABS
10.0000 mg | ORAL_TABLET | Freq: Every day | ORAL | Status: DC
  Administered 2019-03-25: 10 mg via ORAL
  Filled 2019-03-24: qty 1

## 2019-03-24 MED ORDER — HYDROMORPHONE HCL 1 MG/ML IJ SOLN
INTRAMUSCULAR | Status: AC
  Filled 2019-03-24: qty 1

## 2019-03-24 MED ORDER — MIDAZOLAM HCL 2 MG/2ML IJ SOLN
INTRAMUSCULAR | Status: AC
  Filled 2019-03-24: qty 2

## 2019-03-24 MED ORDER — MEPERIDINE HCL 25 MG/ML IJ SOLN: 6.2500 mg | INTRAMUSCULAR | Status: DC | PRN

## 2019-03-24 MED ORDER — LACTATED RINGERS IV SOLN: INTRAVENOUS | Status: DC

## 2019-03-24 MED ORDER — ASCORBIC ACID 500 MG PO TABS
500.0000 mg | ORAL_TABLET | Freq: Every day | ORAL | Status: DC
  Administered 2019-03-25: 500 mg via ORAL
  Filled 2019-03-24: qty 1

## 2019-03-24 MED ORDER — LACTATED RINGERS IV SOLN: INTRAVENOUS | Status: DC | PRN

## 2019-03-24 MED ORDER — DOCUSATE SODIUM 100 MG PO CAPS
100.0000 mg | ORAL_CAPSULE | Freq: Two times a day (BID) | ORAL | Status: DC
  Administered 2019-03-25: 100 mg via ORAL
  Filled 2019-03-24 (×2): qty 1

## 2019-03-24 MED ORDER — TRANEXAMIC ACID-NACL 1000-0.7 MG/100ML-% IV SOLN
1000.0000 mg | INTRAVENOUS | Status: AC
  Administered 2019-03-24: 13:00:00 1000 mg via INTRAVENOUS

## 2019-03-24 MED ORDER — ROCURONIUM BROMIDE 100 MG/10ML IV SOLN
INTRAVENOUS | Status: DC | PRN
  Administered 2019-03-24: 50 mg via INTRAVENOUS

## 2019-03-24 MED ORDER — ALUM & MAG HYDROXIDE-SIMETH 200-200-20 MG/5ML PO SUSP: 30.0000 mL | ORAL | Status: DC | PRN

## 2019-03-24 MED ORDER — OXYCODONE HCL 5 MG PO TABS
5.0000 mg | ORAL_TABLET | Freq: Once | ORAL | Status: AC | PRN
  Administered 2019-03-24: 5 mg via ORAL

## 2019-03-24 MED ORDER — ROCURONIUM BROMIDE 10 MG/ML (PF) SYRINGE
PREFILLED_SYRINGE | INTRAVENOUS | Status: AC
  Filled 2019-03-24: qty 10

## 2019-03-24 MED ORDER — PROPOFOL 10 MG/ML IV BOLUS
INTRAVENOUS | Status: AC
  Filled 2019-03-24: qty 20

## 2019-03-24 MED ORDER — CHLORHEXIDINE GLUCONATE 4 % EX LIQD: 60.0000 mL | Freq: Once | CUTANEOUS | Status: DC

## 2019-03-24 MED ORDER — ADULT MULTIVITAMIN W/MINERALS CH
1.0000 | ORAL_TABLET | Freq: Every day | ORAL | Status: DC
  Administered 2019-03-25: 1 via ORAL
  Filled 2019-03-24: qty 1

## 2019-03-24 MED ORDER — CEFAZOLIN SODIUM-DEXTROSE 1-4 GM/50ML-% IV SOLN
1.0000 g | Freq: Four times a day (QID) | INTRAVENOUS | Status: AC
  Administered 2019-03-24 (×2): 1 g via INTRAVENOUS
  Filled 2019-03-24 (×2): qty 50

## 2019-03-24 MED ORDER — METOCLOPRAMIDE HCL 5 MG PO TABS: 5.0000 mg | ORAL_TABLET | Freq: Three times a day (TID) | ORAL | Status: DC | PRN

## 2019-03-24 MED ORDER — LOSARTAN POTASSIUM 50 MG PO TABS
100.0000 mg | ORAL_TABLET | Freq: Every day | ORAL | Status: DC
  Administered 2019-03-24 – 2019-03-25 (×2): 100 mg via ORAL
  Filled 2019-03-24 (×2): qty 2

## 2019-03-24 MED ORDER — PHENOL 1.4 % MT LIQD: 1.0000 | OROMUCOSAL | Status: DC | PRN

## 2019-03-24 MED ORDER — ACETAMINOPHEN 500 MG PO TABS: 1000.0000 mg | ORAL_TABLET | Freq: Once | ORAL | Status: DC

## 2019-03-24 SURGICAL SUPPLY — 55 items
BENZOIN TINCTURE PRP APPL 2/3 (GAUZE/BANDAGES/DRESSINGS) IMPLANT
BLADE CLIPPER SURG (BLADE) IMPLANT
BLADE SAW SGTL 18X1.27X75 (BLADE) ×2 IMPLANT
BLADE SAW SGTL 18X1.27X75MM (BLADE) ×1
CLOSURE WOUND 1/2 X4 (GAUZE/BANDAGES/DRESSINGS)
COVER SURGICAL LIGHT HANDLE (MISCELLANEOUS) ×3 IMPLANT
DRAPE C-ARM 42X72 X-RAY (DRAPES) ×3 IMPLANT
DRAPE STERI IOBAN 125X83 (DRAPES) ×3 IMPLANT
DRAPE U-SHAPE 47X51 STRL (DRAPES) ×9 IMPLANT
DRESSING AQUACEL AG SP 3.5X10 (GAUZE/BANDAGES/DRESSINGS) ×1 IMPLANT
DRSG AQUACEL AG ADV 3.5X10 (GAUZE/BANDAGES/DRESSINGS) ×3 IMPLANT
DRSG AQUACEL AG SP 3.5X10 (GAUZE/BANDAGES/DRESSINGS) ×3
DURAPREP 26ML APPLICATOR (WOUND CARE) ×3 IMPLANT
ELECT BLADE 4.0 EZ CLEAN MEGAD (MISCELLANEOUS) ×3
ELECT BLADE 6.5 EXT (BLADE) IMPLANT
ELECT REM PT RETURN 9FT ADLT (ELECTROSURGICAL) ×3
ELECTRODE BLDE 4.0 EZ CLN MEGD (MISCELLANEOUS) ×1 IMPLANT
ELECTRODE REM PT RTRN 9FT ADLT (ELECTROSURGICAL) ×1 IMPLANT
FACESHIELD WRAPAROUND (MASK) ×6 IMPLANT
GAUZE XEROFORM 1X8 LF (GAUZE/BANDAGES/DRESSINGS) ×3 IMPLANT
GLOVE BIOGEL PI IND STRL 8 (GLOVE) ×2 IMPLANT
GLOVE BIOGEL PI INDICATOR 8 (GLOVE) ×4
GLOVE ECLIPSE 8.0 STRL XLNG CF (GLOVE) ×3 IMPLANT
GLOVE ORTHO TXT STRL SZ7.5 (GLOVE) ×6 IMPLANT
GOWN STRL REUS W/ TWL LRG LVL3 (GOWN DISPOSABLE) ×2 IMPLANT
GOWN STRL REUS W/ TWL XL LVL3 (GOWN DISPOSABLE) ×2 IMPLANT
GOWN STRL REUS W/TWL LRG LVL3 (GOWN DISPOSABLE) ×4
GOWN STRL REUS W/TWL XL LVL3 (GOWN DISPOSABLE) ×4
HANDPIECE INTERPULSE COAX TIP (DISPOSABLE) ×2
HEAD CERAMIC DELTA 36 PLUS 1.5 (Hips) ×3 IMPLANT
KIT BASIN OR (CUSTOM PROCEDURE TRAY) ×3 IMPLANT
KIT TURNOVER KIT B (KITS) ×3 IMPLANT
LINER NEUTRAL 52X36MM PLUS 4 (Liner) ×3 IMPLANT
MANIFOLD NEPTUNE II (INSTRUMENTS) ×3 IMPLANT
NS IRRIG 1000ML POUR BTL (IV SOLUTION) ×3 IMPLANT
PACK TOTAL JOINT (CUSTOM PROCEDURE TRAY) ×3 IMPLANT
PIN SECTOR W/GRIP ACE CUP 52MM (Hips) ×3 IMPLANT
SET HNDPC FAN SPRY TIP SCT (DISPOSABLE) ×1 IMPLANT
STAPLER VISISTAT 35W (STAPLE) IMPLANT
STEM FEMORAL SZ 5MM STD ACTIS (Stem) ×3 IMPLANT
STRIP CLOSURE SKIN 1/2X4 (GAUZE/BANDAGES/DRESSINGS) IMPLANT
SUT ETHIBOND NAB CT1 #1 30IN (SUTURE) ×3 IMPLANT
SUT MNCRL AB 4-0 PS2 18 (SUTURE) IMPLANT
SUT VIC AB 0 CT1 27 (SUTURE) ×2
SUT VIC AB 0 CT1 27XBRD ANBCTR (SUTURE) ×1 IMPLANT
SUT VIC AB 1 CT1 27 (SUTURE) ×2
SUT VIC AB 1 CT1 27XBRD ANBCTR (SUTURE) ×1 IMPLANT
SUT VIC AB 2-0 CT1 27 (SUTURE) ×2
SUT VIC AB 2-0 CT1 TAPERPNT 27 (SUTURE) ×1 IMPLANT
TOWEL GREEN STERILE (TOWEL DISPOSABLE) ×3 IMPLANT
TOWEL GREEN STERILE FF (TOWEL DISPOSABLE) ×3 IMPLANT
TRAY CATH 16FR W/PLASTIC CATH (SET/KITS/TRAYS/PACK) IMPLANT
TRAY FOLEY W/BAG SLVR 16FR (SET/KITS/TRAYS/PACK)
TRAY FOLEY W/BAG SLVR 16FR ST (SET/KITS/TRAYS/PACK) IMPLANT
WATER STERILE IRR 1000ML POUR (IV SOLUTION) ×6 IMPLANT

## 2019-03-24 NOTE — H&P (Signed)
TOTAL HIP ADMISSION H&P  Patient is admitted for right total hip arthroplasty.  Subjective:  Chief Complaint: right hip pain  HPI: Alyssa Hopkins, 64 y.o. female, has a history of pain and functional disability in the right hip(s) due to arthritis and patient has failed non-surgical conservative treatments for greater than 12 weeks to include NSAID's and/or analgesics, corticosteriod injections, flexibility and strengthening excercises and activity modification.  Onset of symptoms was gradual starting 5 years ago with gradually worsening course since that time.The patient noted no past surgery on the right hip(s).  Patient currently rates pain in the right hip at 10 out of 10 with activity. Patient has night pain, worsening of pain with activity and weight bearing, pain that interfers with activities of daily living and pain with passive range of motion. Patient has evidence of subchondral cysts, subchondral sclerosis, periarticular osteophytes and joint space narrowing by imaging studies. This condition presents safety issues increasing the risk of falls.  There is no current active infection.  Patient Active Problem List   Diagnosis Date Noted  . Unilateral primary osteoarthritis, right hip 11/10/2018   Past Medical History:  Diagnosis Date  . Arthritis   . History of hiatal hernia   . Hypertension   . PONV (postoperative nausea and vomiting)     Past Surgical History:  Procedure Laterality Date  . CHOLECYSTECTOMY    . COLONOSCOPY    . DILATION AND CURETTAGE OF UTERUS      Current Facility-Administered Medications  Medication Dose Route Frequency Provider Last Rate Last Admin  . acetaminophen (TYLENOL) tablet 1,000 mg  1,000 mg Oral Once Finucane, Elizabeth M, DO      . ceFAZolin (ANCEF) 2-4 GM/100ML-% IVPB           . ceFAZolin (ANCEF) IVPB 2g/100 mL premix  2 g Intravenous On Call to OR Pete Pelt, PA-C      . chlorhexidine (HIBICLENS) 4 % liquid 4 application  60 mL  Topical Once Pete Pelt, PA-C      . lactated ringers infusion   Intravenous Continuous Pervis Hocking, DO 10 mL/hr at 03/24/19 1043 New Bag at 03/24/19 1043  . povidone-iodine 10 % swab 2 application  2 application Topical Once Pete Pelt, PA-C      . scopolamine (TRANSDERM-SCOP) 1 MG/3DAYS 1.5 mg  1 patch Transdermal Q72H Pervis Hocking, DO   1.5 mg at 03/24/19 1047  . tranexamic acid (CYKLOKAPRON) 1000MG /123mL IVPB           . tranexamic acid (CYKLOKAPRON) IVPB 1,000 mg  1,000 mg Intravenous To OR Pete Pelt, PA-C       Allergies  Allergen Reactions  . Statins Other (See Comments)    Severe malagia  . Erythromycin Base Rash    Social History   Tobacco Use  . Smoking status: Former Research scientist (life sciences)  . Smokeless tobacco: Never Used  Substance Use Topics  . Alcohol use: Not on file    Comment: occasional    History reviewed. No pertinent family history.   Review of Systems  All other systems reviewed and are negative.   Objective:  Physical Exam  Constitutional: She is oriented to person, place, and time. She appears well-developed and well-nourished.  HENT:  Head: Normocephalic and atraumatic.  Eyes: Pupils are equal, round, and reactive to light. EOM are normal.  Cardiovascular: Normal rate.  Respiratory: Effort normal.  GI: Soft.  Musculoskeletal:     Cervical back: Normal range of  motion and neck supple.     Right hip: Tenderness and bony tenderness present. Decreased range of motion. Decreased strength.  Neurological: She is alert and oriented to person, place, and time.  Skin: Skin is warm and dry.  Psychiatric: She has a normal mood and affect.    Vital signs in last 24 hours: Temp:  [99.1 F (37.3 C)] 99.1 F (37.3 C) (02/16 1023) Pulse Rate:  [106-108] 108 (02/16 1044) Resp:  [19] 19 (02/16 1023) BP: (167-170)/(89-97) 170/89 (02/16 1044) SpO2:  [99 %] 99 % (02/16 1023) Weight:  [73.9 kg] 73.9 kg (02/16 1023)  Labs:   Estimated  body mass index is 27.12 kg/m as calculated from the following:   Height as of this encounter: 5\' 5"  (1.651 m).   Weight as of this encounter: 73.9 kg.   Imaging Review Plain radiographs demonstrate severe degenerative joint disease of the right hip(s). The bone quality appears to be excellent for age and reported activity level.      Assessment/Plan:  End stage arthritis, right hip(s)  The patient history, physical examination, clinical judgement of the provider and imaging studies are consistent with end stage degenerative joint disease of the right hip(s) and total hip arthroplasty is deemed medically necessary. The treatment options including medical management, injection therapy, arthroscopy and arthroplasty were discussed at length. The risks and benefits of total hip arthroplasty were presented and reviewed. The risks due to aseptic loosening, infection, stiffness, dislocation/subluxation,  thromboembolic complications and other imponderables were discussed.  The patient acknowledged the explanation, agreed to proceed with the plan and consent was signed. Patient is being admitted for inpatient treatment for surgery, pain control, PT, OT, prophylactic antibiotics, VTE prophylaxis, progressive ambulation and ADL's and discharge planning.The patient is planning to be discharged home with home health services    Patient's anticipated LOS is less than 2 midnights, meeting these requirements: - Younger than 45 - Lives within 1 hour of care - Has a competent adult at home to recover with post-op recover - NO history of  - Chronic pain requiring opiods  - Diabetes  - Coronary Artery Disease  - Heart failure  - Heart attack  - Stroke  - DVT/VTE  - Cardiac arrhythmia  - Respiratory Failure/COPD  - Renal failure  - Anemia  - Advanced Liver disease

## 2019-03-24 NOTE — Transfer of Care (Signed)
Immediate Anesthesia Transfer of Care Note  Patient: Alyssa Hopkins  Procedure(s) Performed: RIGHT TOTAL HIP ARTHROPLASTY ANTERIOR APPROACH (Right Hip)  Patient Location: PACU  Anesthesia Type:General and Spinal  Level of Consciousness: oriented, drowsy and patient cooperative  Airway & Oxygen Therapy: Patient Spontanous Breathing and Patient connected to face mask oxygen  Post-op Assessment: Report given to RN and Post -op Vital signs reviewed and stable  Post vital signs: Reviewed  Last Vitals:  Vitals Value Taken Time  BP 152/94 03/24/19 1403  Temp    Pulse 92 03/24/19 1406  Resp 15 03/24/19 1406  SpO2 100 % 03/24/19 1406  Vitals shown include unvalidated device data.  Last Pain:  Vitals:   03/24/19 1039  TempSrc:   PainSc: 7       Patients Stated Pain Goal: 5 ([id number])  Complications: No apparent anesthesia complications

## 2019-03-24 NOTE — Brief Op Note (Signed)
03/24/2019  1:40 PM  PATIENT:  Alyssa Hopkins  64 y.o. female  PRE-OPERATIVE DIAGNOSIS:  Right hip osteoarthritis  POST-OPERATIVE DIAGNOSIS:  Right hip osteoarthritis  PROCEDURE:  Procedure(s): RIGHT TOTAL HIP ARTHROPLASTY ANTERIOR APPROACH (Right)  SURGEON:  Surgeon(s) and Role:    Mcarthur Rossetti, MD - Primary  PHYSICIAN ASSISTANT:  Benita Stabile, PA-C  ANESTHESIA:   spinal and general  EBL:  200 mL   COUNTS:  YES  DICTATION: .Other Dictation: Dictation Number [id number]  PLAN OF CARE: Admit for overnight observation  PATIENT DISPOSITION:  PACU - hemodynamically stable.   Delay start of Pharmacological VTE agent (>24hrs) due to surgical blood loss or risk of bleeding: no

## 2019-03-24 NOTE — Progress Notes (Signed)
Orthopedic Tech Progress Note Patient Details:  Alyssa Hopkins May 29, 1955 [id number]  Ortho Devices Ortho Device/Splint Location: Trapeze bar Ortho Device/Splint Interventions: Application   Post Interventions Patient Tolerated: Well Instructions Provided: Care of device   [place] Pink 03/24/2019, 6:36 PM

## 2019-03-24 NOTE — Anesthesia Postprocedure Evaluation (Signed)
Anesthesia Post Note  Patient: Alyssa Hopkins  Procedure(s) Performed: RIGHT TOTAL HIP ARTHROPLASTY ANTERIOR APPROACH (Right Hip)     Patient location during evaluation: PACU Anesthesia Type: General Level of consciousness: sedated Pain management: pain level controlled Vital Signs Assessment: post-procedure vital signs reviewed and stable Respiratory status: spontaneous breathing and respiratory function stable Cardiovascular status: stable Postop Assessment: no apparent nausea or vomiting Anesthetic complications: no    Last Vitals:  Vitals:   03/24/19 1435 03/24/19 1508  BP: 139/80 (!) 151/79  Pulse: 87 72  Resp: 11 17  Temp:  (!) 36.4 C  SpO2: 93% 100%    Last Pain:  Vitals:   03/24/19 1508  TempSrc: Oral  PainSc:                  [name] DANIEL

## 2019-03-24 NOTE — Evaluation (Signed)
Physical Therapy Evaluation Patient Details Name: Alyssa Hopkins MRN: [id number] DOB: [birth year] Today's Date: 03/24/2019   History of Present Illness  Pt is a 64 y/o female s/p R THA, direct anterior. PMH includes HTN and arthritis.   Clinical Impression  Pt is s/p surgery above with deficits below. Pt with increased dizziness upon sitting that did not improve, therefore, returned to supine. Pt requiring min A to perform bed mobility. Reviewed supine HEP. Feel pt will progress well once symptoms improve. Will continue to follow acutely to maximize functional mobility independence and safety.     Follow Up Recommendations Follow surgeon's recommendation for DC plan and follow-up therapies;Supervision for mobility/OOB    Equipment Recommendations  Rolling walker with 5" wheels;3in1 (PT)    Recommendations for Other Services       Precautions / Restrictions Precautions Precautions: Fall Restrictions Weight Bearing Restrictions: Yes RLE Weight Bearing: Weight bearing as tolerated      Mobility  Bed Mobility Overal bed mobility: Needs Assistance Bed Mobility: Supine to Sit;Sit to Supine     Supine to sit: Min assist Sit to supine: Min assist   General bed mobility comments: Min A for RLE management. Increased time required. Pt reports dizziness upon sitting that did not improve, therefore, returned to supine.   Transfers                    Ambulation/Gait                Stairs            Wheelchair Mobility    Modified Rankin (Stroke Patients Only)       Balance Overall balance assessment: Needs assistance Sitting-balance support: No upper extremity supported;Feet supported Sitting balance-Leahy Scale: Fair                                       Pertinent Vitals/Pain Pain Assessment: 0-10 Pain Score: 2  Pain Location: R hip  Pain Descriptors / Indicators: Aching;Operative site guarding Pain Intervention(s): Limited  activity within patient's tolerance;Monitored during session;Repositioned    Home Living Family/patient expects to be discharged to:: Private residence Living Arrangements: Spouse/significant other Available Help at Discharge: Family Type of Home: House Home Access: Stairs to enter Entrance Stairs-Rails: None Entrance Stairs-Number of Steps: 3 Home Layout: One level Home Equipment: Cane - single point;Shower seat      Prior Function Level of Independence: Independent with assistive device(s)         Comments: Was using cane for ambulation      Hand Dominance        Extremity/Trunk Assessment   Upper Extremity Assessment Upper Extremity Assessment: Overall WFL for tasks assessed    Lower Extremity Assessment Lower Extremity Assessment: RLE deficits/detail RLE Deficits / Details: Deficits consistent with post op pain and weakness.     Cervical / Trunk Assessment Cervical / Trunk Assessment: Normal  Communication   Communication: No difficulties  Cognition Arousal/Alertness: Awake/alert Behavior During Therapy: WFL for tasks assessed/performed Overall Cognitive Status: Within Functional Limits for tasks assessed                                        General Comments General comments (skin integrity, edema, etc.): Pt's husband present during session     Exercises Total  Joint Exercises Ankle Circles/Pumps: AROM;Both;20 reps Quad Sets: AROM;Right;10 reps   Assessment/Plan    PT Assessment Patient needs continued PT services  PT Problem List Decreased strength;Decreased balance;Decreased range of motion;Decreased activity tolerance;Decreased mobility;Decreased knowledge of use of DME;Pain       PT Treatment Interventions Gait training;Stair training;Functional mobility training;Therapeutic activities;Therapeutic exercise;Balance training;Patient/family education;DME instruction    PT Goals (Current goals can be found in the Care Plan section)   Acute Rehab PT Goals Patient Stated Goal: to feel better PT Goal Formulation: With patient Time For Goal Achievement: 04/07/19 Potential to Achieve Goals: Good    Frequency 7X/week   Barriers to discharge        Co-evaluation               AM-PAC PT "6 Clicks" Mobility  Outcome Measure Help needed turning from your back to your side while in a flat bed without using bedrails?: A Little Help needed moving from lying on your back to sitting on the side of a flat bed without using bedrails?: A Little Help needed moving to and from a bed to a chair (including a wheelchair)?: A Lot Help needed standing up from a chair using your arms (e.g., wheelchair or bedside chair)?: A Lot Help needed to walk in hospital room?: A Lot Help needed climbing 3-5 steps with a railing? : A Lot 6 Click Score: 14    End of Session Equipment Utilized During Treatment: Gait belt Activity Tolerance: Treatment limited secondary to medical complications (Comment)(dizziness) Patient left: in bed;with call bell/phone within reach;with family/visitor present Nurse Communication: Mobility status PT Visit Diagnosis: Muscle weakness (generalized) (M62.81);Pain Pain - Right/Left: Right Pain - part of body: Hip    Time: 1635-1700 PT Time Calculation (min) (ACUTE ONLY): 25 min   Charges:   PT Evaluation $PT Eval Low Complexity: 1 Low PT Treatments $Therapeutic Activity: 8-22 mins        Lou Miner, DPT  Acute Rehabilitation Services  Pager: [phone] Office: [phone]   Rudean Hitt 03/24/2019, 5:35 PM

## 2019-03-24 NOTE — Op Note (Signed)
NAME: RIN, NOVIELLO MEDICAL RECORD [id number] ACCOUNT [id number] DATE OF BIRTH:[birth year] FACILITY: [facility] LOCATION: [facility] PHYSICIAN:Fardowsa Authier Kerry Fort, MD  OPERATIVE REPORT  DATE OF PROCEDURE:  03/24/2019  PREOPERATIVE DIAGNOSIS:  Primary osteoarthritis and degenerative joint disease, right hip.  POSTOPERATIVE DIAGNOSIS:  Primary osteoarthritis and degenerative joint disease, right hip.  PROCEDURE:  Right total hip arthroplasty through direct anterior approach.  IMPLANTS:  DePuy Sector Gription acetabular component size 52, size 36+4 neutral polyethylene liner, size 5 Actis femoral component with standard offset, size 36+1.5 ceramic hip ball.  SURGEON:  Lind Guest.  Ninfa Linden, MD  ASSISTANT:  Erskine Emery, PA-C  ANESTHESIA: 1.  Attempted spinal. 2.  General.  ESTIMATED BLOOD LOSS:  150 mL.  ANTIBIOTICS:  Two g IV Ancef.  COMPLICATIONS:  None.  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  Her hip has become more painful as each year has passe.  At this point, her hip pain is detrimentally affecting her mobility, her quality  of life and activities of daily living.  She does have x-rays showing severe end-stage arthritis of the right hip.  She does wish to proceed with total hip arthroplasty at this point given the fairly conservative treatment.  We talked about the risk of  acute blood loss anemia, nerve or vessel injury, fracture, infection, dislocation, DVT and implant failure.  We talked about our goals being decreased pain, improved mobility and overall improved quality of life.  DESCRIPTION OF PROCEDURE:  After informed consent was obtained, the appropriate right hip was marked.  She was brought to the operating room and sat up on a stretcher where spinal anesthesia was obtained.  She was then laid in the supine position and a  Foley catheter was placed.  The spinal was late on setting  because she could still feel things in her legs and lift her legs easily.  General anesthesia then once was then obtained.  So we then placed her supine on the Hana fracture table, the perineal  post in place and both legs in line skeletal traction device and no traction applied.  Of note, her clinical exam shows that she is short on her right operative side and her x-rays show she is short as well as our goal is to have a stable hip that  hopefully we can decrease her pain and lengthen her as well.  After we got her supine on the operating table, the hip was prepped and draped with DuraPrep and sterile drapes.  A time-out was called.  She was identified as correct patient, correct right hip.  We then made an incision just inferior and posterior to  the anterior superior iliac spine and carried this obliquely down the leg.  We dissected down to tensor fascia lata muscle and the tensor fascia was then divided longitudinally to proceed with direct anterior approach to the hip.  We identified and  cauterized circumflex vessels and identified the hip capsule, opened up the hip capsule in an L-type format, finding a moderate joint effusion and significant periarticular osteophytes around the femoral head and neck.  We placed curved retractors around  the medial and lateral femoral neck and made our femoral neck cut with an oscillating saw just proximal to the lesser trochanter and completed this with an osteotome.  We placed a corkscrew guide in the femoral head and removed the femoral head in its  entirety and found it to be completely devoid of cartilage.  The acetabulum was also quite sclerotic.  We placed a bent Hohmann over the medial acetabular rim and removed remnants of the acetabular labrum and other debris.  We then began reaming using  the reamers from a size 44 reamer in stepwise increments up to a size 51, with all reamers under direct visualization, the last reamer placed under direct fluoroscopy,  so we could obtain our depth of reaming, our inclination and anteversion.  I then  placed the real DePuy Sector Gription acetabular component size 52 and a 36+4 neutral polyethylene liner.  Attention was then turned to the femur.  With the leg externally rotated to 120 degrees, extended and adducted, we were able to place a Mueller  retractor medially and a Hohman retractor behind the greater trochanter.  We released the lateral joint capsule and used a box-cutting osteotome to enter the femoral canal and a rongeur to lateralize.  We then began broaching using the Actis broaching  system from a size zero going up to a size 5.  With a size 5 in place, we trialed a standard femoral neck and a 36+1.5 hip ball.  We brought the leg back over and up and with traction and internal rotation, reducing the pelvis and we were pleased with  leg length, offset, range of motion and stability assessed radiographically and mechanically.  We then dislocated the hip and removed the trial components.  I then placed the real Actis femoral component with standard offset size 5 from DePuy and the  real 36+1.5 ceramic hip ball, again reduced this in the acetabulum.  We were pleased with stability, leg length, offset and range of motion.  We then irrigated the soft tissue with normal saline solution using pulsatile lavage.  We were able to close the  joint capsule with interrupted #1 Ethibond suture.  We closed the tensor fascia with #1 Vicryl, followed by 0 Vicryl to close deep tissue, 2-0 Vicryl was used to close subcutaneous tissue and interrupted staples were used to reapproximate the skin.   Xeroform and Aquacel dressing were applied.  She was taken off the Hana table, awakened, extubated and taken to the recovery room in stable condition.  All final counts were correct.  There were no complications noted.  Of note, Benita Stabile, PA-C, assisted  during the entire case and his assistance was crucial for facilitating all aspects of  this case.  VN/NUANCE  D:03/24/2019 T:03/24/2019 JOB:[id number]

## 2019-03-24 NOTE — Plan of Care (Signed)
  Problem: Education: Goal: Knowledge of General Education information will improve Description: Including pain rating scale, medication(s)/side effects and non-pharmacologic comfort measures Outcome: Progressing   Problem: Clinical Measurements: Goal: Respiratory complications will improve Outcome: Progressing Note: On room air   Problem: Nutrition: Goal: Adequate nutrition will be maintained Outcome: Progressing   Problem: Coping: Goal: Level of anxiety will decrease Outcome: Progressing   Problem: Elimination: Goal: Will not experience complications related to urinary retention Outcome: Progressing Note: Foley in place   Problem: Pain Managment: Goal: General experience of comfort will improve Outcome: Progressing Note: Treated once with oxycodone after coming back from PACU, and then treated with scheduled toradol   Problem: Safety: Goal: Ability to remain free from injury will improve Outcome: Progressing   Problem: Activity: Goal: Ability to avoid complications of mobility impairment will improve Outcome: Progressing

## 2019-03-24 NOTE — Anesthesia Procedure Notes (Signed)

## 2019-03-25 ENCOUNTER — Encounter: Payer: Self-pay | Admitting: *Deleted

## 2019-03-25 ENCOUNTER — Other Ambulatory Visit (HOSPITAL_COMMUNITY): Payer: Self-pay | Admitting: Radiology

## 2019-03-25 LAB — BASIC METABOLIC PANEL
Anion gap: 13 (ref 5–15)
BUN: 18 mg/dL (ref 8–23)
CO2: 24 mmol/L (ref 22–32)
Calcium: 9.4 mg/dL (ref 8.9–10.3)
Chloride: 103 mmol/L (ref 98–111)
Creatinine, Ser: 0.85 mg/dL (ref 0.44–1.00)
GFR calc Af Amer: >60 mL/min (ref >60)
GFR calc non Af Amer: >60 mL/min (ref >60)
Glucose, Bld: 174 mg/dL — ABNORMAL HIGH (ref 70–99)
Potassium: 4.7 mmol/L (ref 3.5–5.1)
Sodium: 140 mmol/L (ref 135–145)

## 2019-03-25 LAB — CBC
HCT: 35.8 % — ABNORMAL LOW (ref 36.0–46.0)
Hemoglobin: 11.5 g/dL — ABNORMAL LOW (ref 12.0–15.0)
MCH: 29.5 pg (ref 26.0–34.0)
MCHC: 32.1 g/dL (ref 30.0–36.0)
MCV: 91.8 fL (ref 80.0–100.0)
Platelets: 231 10*3/uL (ref 150–400)
RBC: 3.9 MIL/uL (ref 3.87–5.11)
RDW: 13.9 % (ref 11.5–15.5)
WBC: 11.5 10*3/uL — ABNORMAL HIGH (ref 4.0–10.5)
nRBC: 0 % (ref 0.0–0.2)

## 2019-03-25 MED ORDER — METHOCARBAMOL 500 MG PO TABS: 500.0000 mg | ORAL_TABLET | Freq: Four times a day (QID) | ORAL | 1 refills | Status: DC | PRN

## 2019-03-25 MED ORDER — METHOCARBAMOL 500 MG PO TABS
500.0000 mg | ORAL_TABLET | Freq: Four times a day (QID) | ORAL | Status: DC | PRN
  Administered 2019-03-25: 500 mg via ORAL
  Filled 2019-03-25: qty 1

## 2019-03-25 MED ORDER — OXYCODONE HCL 5 MG PO TABS: 5.0000 mg | ORAL_TABLET | ORAL | 0 refills | Status: AC | PRN

## 2019-03-25 MED ORDER — ASPIRIN 81 MG PO CHEW: 81.0000 mg | CHEWABLE_TABLET | Freq: Two times a day (BID) | ORAL | 0 refills | Status: AC

## 2019-03-25 MED FILL — oxyCODONE HCL 5 MG TABS: 5 | 3 days supply | Qty: 30 | Fill #0

## 2019-03-25 MED FILL — METHOCARBAMOL 500 MG TABS: 500 | 15 days supply | Qty: 60 | Fill #0

## 2019-03-25 NOTE — Progress Notes (Signed)
Pt discharged @ 1542. Accompanied by Becky NA. Pt AVS was discussed with pt. No issues was brought up by pt during discharge. Pt v/s stable prior to discharge.

## 2019-03-25 NOTE — Progress Notes (Signed)
Subjective: 1 Day Post-Op Procedure(s) (LRB): RIGHT TOTAL HIP ARTHROPLASTY ANTERIOR APPROACH (Right) Patient reports pain as moderate.    Objective: Vital signs in last 24 hours: Temp:  [97.2 F (36.2 C)-99.1 F (37.3 C)] 97.2 F (36.2 C) (02/17 0349) Pulse Rate:  [70-108] 70 (02/17 0349) Resp:  [11-19] 14 (02/17 0349) BP: (104-170)/(68-97) 104/68 (02/17 0349) SpO2:  [93 %-100 %] 99 % (02/17 0349) Weight:  [73.9 kg] 73.9 kg (02/16 1023)  Intake/Output from previous day: 02/16 0701 - 02/17 0700 In: 2030.5 [P.O.:290; I.V.:1540.5; IV Piggyback:200] Out: 1400 [Urine:1200; Blood:200] Intake/Output this shift: No intake/output data recorded.  Recent Labs    03/25/19 0332  HGB 11.5*   Recent Labs    03/25/19 0332  WBC 11.5*  RBC 3.90  HCT 35.8*  PLT 231   Recent Labs    03/25/19 0332  NA 140  K 4.7  CL 103  CO2 24  BUN 18  CREATININE 0.85  GLUCOSE 174*  CALCIUM 9.4   No results for input(s): LABPT, INR in the last 72 hours.  Sensation intact distally Intact pulses distally Dorsiflexion/Plantar flexion intact Incision: dressing C/D/I   Assessment/Plan: 1 Day Post-Op Procedure(s) (LRB): RIGHT TOTAL HIP ARTHROPLASTY ANTERIOR APPROACH (Right) Up with therapy Discharge home with home health next 1-2 days depending on progress with therapy.      Mcarthur Rossetti 03/25/2019, 7:39 AM

## 2019-03-25 NOTE — Plan of Care (Signed)
  Problem: Education: Goal: Knowledge of General Education information will improve Description: Including pain rating scale, medication(s)/side effects and non-pharmacologic comfort measures Outcome: Completed/Met   Problem: Health Behavior/Discharge Planning: Goal: Ability to manage health-related needs will improve Outcome: Completed/Met   Problem: Clinical Measurements: Goal: Ability to maintain clinical measurements within normal limits will improve Outcome: Completed/Met Goal: Will remain free from infection Outcome: Completed/Met Goal: Diagnostic test results will improve Outcome: Completed/Met Goal: Respiratory complications will improve Outcome: Completed/Met Goal: Cardiovascular complication will be avoided Outcome: Completed/Met   Problem: Activity: Goal: Risk for activity intolerance will decrease Outcome: Completed/Met   Problem: Nutrition: Goal: Adequate nutrition will be maintained Outcome: Completed/Met   Problem: Coping: Goal: Level of anxiety will decrease Outcome: Completed/Met   Problem: Elimination: Goal: Will not experience complications related to bowel motility Outcome: Completed/Met Goal: Will not experience complications related to urinary retention Outcome: Completed/Met   Problem: Pain Managment: Goal: General experience of comfort will improve Outcome: Completed/Met   Problem: Safety: Goal: Ability to remain free from injury will improve Outcome: Completed/Met   Problem: Skin Integrity: Goal: Risk for impaired skin integrity will decrease Outcome: Completed/Met   Problem: Education: Goal: Knowledge of the prescribed therapeutic regimen will improve Outcome: Completed/Met Goal: Understanding of discharge needs will improve Outcome: Completed/Met Goal: Individualized Educational Video(s) Outcome: Completed/Met   Problem: Activity: Goal: Ability to avoid complications of mobility impairment will improve Outcome:  Completed/Met Goal: Ability to tolerate increased activity will improve Outcome: Completed/Met   Problem: Clinical Measurements: Goal: Postoperative complications will be avoided or minimized Outcome: Completed/Met   Problem: Pain Management: Goal: Pain level will decrease with appropriate interventions Outcome: Completed/Met   Problem: Skin Integrity: Goal: Will show signs of wound healing Outcome: Completed/Met   

## 2019-03-25 NOTE — Discharge Summary (Signed)
Patient ID: Alyssa Hopkins MRN: [id number] DOB/AGE: [birth year] 64 y.o.  Admit date: 03/24/2019 Discharge date: 03/25/2019  Admission Diagnoses:  Principal Problem:   Unilateral primary osteoarthritis, right hip Active Problems:   Status post total replacement of right hip   Discharge Diagnoses:  Same  Past Medical History:  Diagnosis Date  . Arthritis   . History of hiatal hernia   . Hypertension   . PONV (postoperative nausea and vomiting)     Surgeries: Procedure(s): RIGHT TOTAL HIP ARTHROPLASTY ANTERIOR APPROACH on 03/24/2019   Consultants:   Discharged Condition: Improved  Hospital Course: Alyssa Hopkins is an 64 y.o. female who was admitted 03/24/2019 for operative treatment ofUnilateral primary osteoarthritis, right hip. Patient has severe unremitting pain that affects sleep, daily activities, and work/hobbies. After pre-op clearance the patient was taken to the operating room on 03/24/2019 and underwent  Procedure(s): RIGHT TOTAL HIP ARTHROPLASTY ANTERIOR APPROACH.    Patient was given perioperative antibiotics:  Anti-infectives (From admission, onward)   Start     Dose/Rate Route Frequency Ordered Stop   03/24/19 1800  ceFAZolin (ANCEF) IVPB 1 g/50 mL premix     1 g 100 mL/hr over 30 Minutes Intravenous Every 6 hours 03/24/19 1512 03/24/19 2332   03/24/19 1016  ceFAZolin (ANCEF) 2-4 GM/100ML-% IVPB    Note to Pharmacy: Tamsen Snider   : cabinet override      03/24/19 1016 03/24/19 1257   03/24/19 1015  ceFAZolin (ANCEF) IVPB 2g/100 mL premix     2 g 200 mL/hr over 30 Minutes Intravenous On call to O.R. 03/24/19 1010 03/24/19 1235       Patient was given sequential compression devices, early ambulation, and chemoprophylaxis to prevent DVT.  Patient benefited maximally from hospital stay and there were no complications.    Recent vital signs:  Patient Vitals for the past 24 hrs:  BP Temp Temp src Pulse Resp SpO2  03/25/19 0756 107/62 98.1 F (36.7  C) Oral 67 16 99 %  03/25/19 0349 104/68 (!) 97.2 F (36.2 C) Oral 70 14 99 %  03/24/19 2013 106/73 98.2 F (36.8 C) Oral 92 16 96 %  03/24/19 1641 (!) 126/97 -- -- 96 18 96 %  03/24/19 1508 (!) 151/79 (!) 97.5 F (36.4 C) Oral 72 17 100 %  03/24/19 1435 139/80 -- -- 87 11 93 %  03/24/19 1420 132/87 -- -- 86 11 96 %  03/24/19 1405 (!) 152/94 97.8 F (36.6 C) -- 96 19 100 %     Recent laboratory studies:  Recent Labs    03/25/19 0332  WBC 11.5*  HGB 11.5*  HCT 35.8*  PLT 231  NA 140  K 4.7  CL 103  CO2 24  BUN 18  CREATININE 0.85  GLUCOSE 174*  CALCIUM 9.4     Discharge Medications:   Allergies as of 03/25/2019      Reactions   Statins Other (See Comments)   Severe malagia   Erythromycin Base Rash      Medication List    STOP taking these medications   diclofenac 75 MG EC tablet Commonly known as: VOLTAREN   TURMERIC PO     TAKE these medications   acetaminophen 650 MG CR tablet Commonly known as: TYLENOL Take 650 mg by mouth daily.   ascorbic acid 500 MG tablet Commonly known as: VITAMIN C Take 500 mg by mouth daily.   aspirin 81 MG chewable tablet Chew 1 tablet (81 mg total) by mouth  2 (two) times daily.   CALCIUM/VITAMIN D PO Take 1 tablet by mouth daily.   ezetimibe 10 MG tablet Commonly known as: ZETIA Take 10 mg by mouth daily.   losartan 100 MG tablet Commonly known as: COZAAR Take 100 mg by mouth daily.   methocarbamol 500 MG tablet Commonly known as: ROBAXIN Take 1 tablet (500 mg total) by mouth every 6 (six) hours as needed for muscle spasms.   Multi-Vitamin tablet Take 1 tablet by mouth daily.   oxyCODONE 5 MG immediate release tablet Commonly known as: Oxy IR/ROXICODONE Take 1-2 tablets (5-10 mg total) by mouth every 4 (four) hours as needed for moderate pain (pain score 4-6).            Durable Medical Equipment  (From admission, onward)         Start     Ordered   03/24/19 1513  DME 3 n 1  Once     03/24/19  1512   03/24/19 1513  DME Walker rolling  Once    Question Answer Comment  Walker: With 5 Inch Wheels   Patient needs a walker to treat with the following condition Status post total replacement of right hip      03/24/19 1512          Diagnostic Studies: DG Pelvis Portable  Result Date: 03/24/2019 CLINICAL DATA:  Total right hip arthroplasty. EXAM: PORTABLE PELVIS 1-2 VIEWS COMPARISON:  Radiographs 11/10/2018 FINDINGS: Well seated components of a total right hip arthroplasty. No complicating features. The pubic symphysis and SI joints are intact. Moderate to advanced left hip joint degenerative changes. IMPRESSION: Well seated components of a total right hip arthroplasty. No complicating features. Electronically Signed   By: Marijo Sanes M.D.   On: 03/24/2019 15:02   DG C-Arm 1-60 Min  Result Date: 03/24/2019 CLINICAL DATA:  Intraoperative imaging for right hip replacement. EXAM: DG C-ARM 1-60 MIN; OPERATIVE RIGHT HIP WITH PELVIS COMPARISON:  Plain films the right hip 11/10/2018. FINDINGS: Two intraoperative fluoroscopic spot views are provided and include the right hip and low pelvis. Right total hip arthroplasty is in place. No acute abnormality is identified. IMPRESSION: Intraoperative imaging for right hip replacement.  No acute finding. Electronically Signed   By: Inge Rise M.D.   On: 03/24/2019 13:49   DG MINI C-ARM IMAGE ONLY  Result Date: 03/24/2019 There is no interpretation for this exam.  This order is for images obtained during a surgical procedure.  Please See "Surgeries" Tab for more information regarding the procedure.   DG HIP OPERATIVE UNILAT W OR W/O PELVIS RIGHT  Result Date: 03/24/2019 CLINICAL DATA:  Intraoperative imaging for right hip replacement. EXAM: DG C-ARM 1-60 MIN; OPERATIVE RIGHT HIP WITH PELVIS COMPARISON:  Plain films the right hip 11/10/2018. FINDINGS: Two intraoperative fluoroscopic spot views are provided and include the right hip and low pelvis.  Right total hip arthroplasty is in place. No acute abnormality is identified. IMPRESSION: Intraoperative imaging for right hip replacement.  No acute finding. Electronically Signed   By: Inge Rise M.D.   On: 03/24/2019 13:49    Disposition: Discharge disposition: 01-Home or [place]    Mcarthur Rossetti, MD Follow up in 2 week(s).   Specialty: Orthopedic Surgery Contact information: [address] [place] [address] [phone]            Signed: Mcarthur Rossetti 03/25/2019, 12:32 PM

## 2019-03-25 NOTE — Progress Notes (Signed)
Physical Therapy Treatment Patient Details Name: Alyssa Hopkins MRN: [id number] DOB: 02/23/1955 Today's Date: 03/25/2019    History of Present Illness Pt is a 64 y/o female s/p R THA, direct anterior. PMH includes HTN and arthritis.     PT Comments    Pt making steady progress with functional mobility and able to progress to transfers and gait training this session without difficulties. Will plan for stair training at next session if appropriate. Pt would continue to benefit from skilled physical therapy services at this time while admitted and after d/c to address the below listed limitations in order to improve overall safety and independence with functional mobility.    Follow Up Recommendations  Outpatient PT     Equipment Recommendations  Rolling walker with 5" wheels;3in1 (PT)    Recommendations for Other Services       Precautions / Restrictions Precautions Precautions: Fall Restrictions Weight Bearing Restrictions: Yes RLE Weight Bearing: Weight bearing as tolerated    Mobility  Bed Mobility Overal bed mobility: Needs Assistance Bed Mobility: Supine to Sit     Supine to sit: Min guard     General bed mobility comments: HOB elevated, no physical assistance needed  Transfers Overall transfer level: Needs assistance Equipment used: Rolling walker (2 wheeled) Transfers: Sit to/from Stand Sit to Stand: Min guard         General transfer comment: cueing for safe hand placement and technique with RW, min guard for safety with transition  Ambulation/Gait Ambulation/Gait assistance: Min guard Gait Distance (Feet): 30 Feet Assistive device: Rolling walker (2 wheeled) Gait Pattern/deviations: Step-through pattern;Decreased step length - right;Decreased step length - left;Decreased stride length;Antalgic Gait velocity: decreased   General Gait Details: pt initially with step-to then progressing with step-through gait pattern bilaterally; heavy use of  bilateral UEs on RW; min guard for safety, no LOB or need for physical assistance   Stairs             Wheelchair Mobility    Modified Rankin (Stroke Patients Only)       Balance Overall balance assessment: Needs assistance Sitting-balance support: No upper extremity supported;Feet supported Sitting balance-Leahy Scale: Fair     Standing balance support: Bilateral upper extremity supported;Single extremity supported Standing balance-Leahy Scale: Poor                              Cognition Arousal/Alertness: Awake/alert Behavior During Therapy: WFL for tasks assessed/performed Overall Cognitive Status: Within Functional Limits for tasks assessed                                        Exercises      General Comments        Pertinent Vitals/Pain Pain Assessment: Faces Faces Pain Scale: Hurts even more Pain Location: R hip  Pain Descriptors / Indicators: Aching;Operative site guarding Pain Intervention(s): Monitored during session;Repositioned;RN gave pain meds during session    Home Living                      Prior Function            PT Goals (current goals can now be found in the care plan section) Acute Rehab PT Goals PT Goal Formulation: With patient Time For Goal Achievement: 04/07/19 Potential to Achieve Goals: Good Progress towards PT goals: Progressing toward  goals    Frequency    7X/week      PT Plan Current plan remains appropriate    Co-evaluation              AM-PAC PT "6 Clicks" Mobility   Outcome Measure  Help needed turning from your back to your side while in a flat bed without using bedrails?: None Help needed moving from lying on your back to sitting on the side of a flat bed without using bedrails?: None Help needed moving to and from a bed to a chair (including a wheelchair)?: None Help needed standing up from a chair using your arms (e.g., wheelchair or bedside chair)?:  None Help needed to walk in hospital room?: A Little Help needed climbing 3-5 steps with a railing? : A Little 6 Click Score: 22    End of Session Equipment Utilized During Treatment: Gait belt Activity Tolerance: Patient tolerated treatment well Patient left: in chair;with call bell/phone within reach Nurse Communication: Mobility status PT Visit Diagnosis: Other abnormalities of gait and mobility (R26.89);Pain Pain - Right/Left: Right Pain - part of body: Hip     Time: [id number] PT Time Calculation (min) (ACUTE ONLY): 16 min  Charges:  $Gait Training: 8-22 mins                     Anastasio Champion, DPT  Acute Rehabilitation Services Pager [phone] Office [place] 03/25/2019, 11:03 AM

## 2019-03-25 NOTE — TOC Initial Note (Signed)
Transition of Care [facility]) - Initial/Assessment Note    Patient Details  Name: Alyssa Hopkins MRN: [id number] Date of Birth: 04-14-1955  Transition of Care [facility] CM/SW Contact:    Bartholomew Crews, RN Phone Number: [phone] 03/25/2019, 1:50 PM  Clinical Narrative:                 Spoke with patient at the bedside. Discussed ordered DME - agreed to RW but declined 3-N-1 stating she already has one. Discussed HH PT - declines need at this time. Patient to transition home today. Has transportation home.     Barriers to Discharge: No Barriers Identified   Patient Goals and CMS Choice   CMS Medicare.gov Compare Post Acute Care list provided to:: Patient Choice offered to / list presented to : Patient  Expected Discharge Plan and Services           Expected Discharge Date: 03/25/19               DME Arranged: Gilford Rile rolling DME Agency: [employer] Date DME Agency Contacted: 04/01/19 Time DME Agency Contacted: [id number] Representative spoke with at DME Agency: [place]: Refused [place] Agency: NA        Prior Living Arrangements/Services                       Activities of Daily Living Home Assistive Devices/Equipment: Eyeglasses, Radio producer (specify quad or straight), Blood pressure cuff, Scales, Hand-held shower hose, Grab bars in shower, Shower chair without back ADL Screening (condition at time of admission) Patient's cognitive ability adequate to safely complete daily activities?: Yes Is the patient deaf or have difficulty hearing?: No Does the patient have difficulty seeing, even when wearing glasses/contacts?: No Does the patient have difficulty concentrating, remembering, or making decisions?: No Patient able to express need for assistance with ADLs?: Yes Does the patient have difficulty dressing or bathing?: No Independently performs ADLs?: Yes (appropriate for developmental age) Does the patient have difficulty walking or climbing stairs?: Yes Weakness of  Legs: Right Weakness of Arms/Hands: None  Permission Sought/Granted                  Emotional Assessment              Admission diagnosis:  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 Unilateral primary osteoarthritis, right hip 11/10/2018   PCP:  Kingsley Callander, MD Pharmacy:   [facility] [address] [place] [address] Phone: [phone] Fax: [place], [place]. [address] [place] [address] Phone: [phone] Fax: [phone]     Social Determinants of Health (SDOH) Interventions    Readmission Risk Interventions No flowsheet data found.
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
## 2019-03-25 NOTE — Progress Notes (Signed)
Physical Therapy Treatment Patient Details Name: Alyssa Hopkins MRN: [id number] DOB: 14-Jun-1955 Today's Date: 03/25/2019    History of Present Illness Pt is a 64 y/o female s/p R THA, direct anterior. PMH includes HTN and arthritis.     PT Comments    Pt making steady progress with functional mobility. She tolerated stair training well this session with min A from therapist for stabilizing anterior aspect of RW. PT also provided handout for pt as well. Plan is to d/c home today (pt hopeful). Pt is ready to d/c from PT perspective.   Pt would continue to benefit from skilled physical therapy services at this time while admitted and after d/c to address the below listed limitations in order to improve overall safety and independence with functional mobility.    Follow Up Recommendations  Outpatient PT     Equipment Recommendations  Rolling walker with 5" wheels;3in1 (PT)    Recommendations for Other Services       Precautions / Restrictions Precautions Precautions: Fall Restrictions Weight Bearing Restrictions: Yes RLE Weight Bearing: Weight bearing as tolerated    Mobility  Bed Mobility               General bed mobility comments: pt OOB in recliner chair upon arrival  Transfers Overall transfer level: Needs assistance Equipment used: Rolling walker (2 wheeled) Transfers: Sit to/from Stand Sit to Stand: Supervision         General transfer comment: good technique, supervision for safety  Ambulation/Gait Ambulation/Gait assistance: Supervision Gait Distance (Feet): 100 Feet Assistive device: Rolling walker (2 wheeled) Gait Pattern/deviations: Step-through pattern;Decreased step length - right;Decreased step length - left;Decreased stride length;Antalgic Gait velocity: decreased   General Gait Details: pt initially with step-to then progressing with step-through gait pattern bilaterally; heavy use of bilateral UEs on RW; min guard for safety, no LOB or  need for physical assistance   Stairs Stairs: Yes Stairs assistance: Min assist Stair Management: No rails;Step to pattern;Backwards;With walker Number of Stairs: 2 General stair comments: PT demonstrated and instructed pt in ascending/descending two steps with RW backwards as pt does not have railing at home; min A needed to stabilize anterior aspect of RW; handout provided as well   Wheelchair Mobility    Modified Rankin (Stroke Patients Only)       Balance Overall balance assessment: Needs assistance Sitting-balance support: No upper extremity supported;Feet supported Sitting balance-Leahy Scale: Fair     Standing balance support: Bilateral upper extremity supported;Single extremity supported Standing balance-Leahy Scale: Poor                              Cognition Arousal/Alertness: Awake/alert Behavior During Therapy: WFL for tasks assessed/performed Overall Cognitive Status: Within Functional Limits for tasks assessed                                        Exercises      General Comments        Pertinent Vitals/Pain Pain Assessment: Faces Faces Pain Scale: Hurts even more Pain Location: R hip  Pain Descriptors / Indicators: Aching;Operative site guarding Pain Intervention(s): Monitored during session;Repositioned    Home Living                      Prior Function  PT Goals (current goals can now be found in the care plan section) Acute Rehab PT Goals PT Goal Formulation: With patient Time For Goal Achievement: 04/07/19 Potential to Achieve Goals: Good Progress towards PT goals: Progressing toward goals    Frequency    7X/week      PT Plan Current plan remains appropriate    Co-evaluation              AM-PAC PT "6 Clicks" Mobility   Outcome Measure  Help needed turning from your back to your side while in a flat bed without using bedrails?: None Help needed moving from lying on your  back to sitting on the side of a flat bed without using bedrails?: None Help needed moving to and from a bed to a chair (including a wheelchair)?: None Help needed standing up from a chair using your arms (e.g., wheelchair or bedside chair)?: None Help needed to walk in hospital room?: A Little Help needed climbing 3-5 steps with a railing? : A Little 6 Click Score: 22    End of Session Equipment Utilized During Treatment: Gait belt Activity Tolerance: Patient tolerated treatment well Patient left: in chair;with call bell/phone within reach;with family/visitor present Nurse Communication: Mobility status PT Visit Diagnosis: Other abnormalities of gait and mobility (R26.89);Pain Pain - Right/Left: Right Pain - part of body: Hip     Time: [id number] PT Time Calculation (min) (ACUTE ONLY): 42 min  Charges:  $Gait Training: 23-37 mins $Therapeutic Activity: 8-22 mins                     Anastasio Champion, DPT  Acute Rehabilitation Services Pager [phone] Office [place] 03/25/2019, 2:27 PM

## 2019-03-25 NOTE — Discharge Instructions (Signed)

## 2019-03-25 NOTE — Consult Note (Addendum)
   [facility] Inpatient Consult   03/25/2019  Alyssa Hopkins 1955-10-28 [id number]   [facility] [[facility]] Status: pending  Spoke with the patient via hospital phone regarding support and benefits of the [place] for planned follow up call.  Encourage patient to express any needs for continued support. HIPAA verified and the best contact number is [phone].  Plan: Will update assigned [place] Coordinator of disposition and follow up.  For questions, please contact:  Natividad Brood, RN BSN [facility] Liaison  [phone] business mobile phone Toll free office [phone]  Fax number: [phone] [email or web address]

## 2019-03-27 ENCOUNTER — Other Ambulatory Visit: Payer: Self-pay | Admitting: *Deleted

## 2019-03-27 ENCOUNTER — Encounter: Payer: Self-pay | Admitting: *Deleted

## 2019-03-27 DIAGNOSIS — I10 Essential (primary) hypertension: Secondary | ICD-10-CM

## 2019-03-27 NOTE — Patient Outreach (Signed)
Alyssa Hopkins) Care Management  03/27/2019  Alyssa Hopkins [birth year] [id number]   Transition of care call/case closure   Referral received:03/23/19 Initial outreach:03/27/19 Insurance: UMR    Subjective: Initial successful telephone call to patient's preferred number in order to complete transition of care assessment; 2 HIPAA identifiers verified. Explained purpose of call and completed transition of care assessment.  States she is recovering at home denies post-operative problems, says surgical incisions are unremarkable, states surgical pain  managed with prescribed medications. She reports tolerating diet but appetite still no good yet. She reports passing gas, no bowel movement yet,review of post discharge measures to help with constipation.   Spouse and friend is  assisting with her recovery. She reports receiving information on exercises to work on at home, no home health at discharge, states no in network agency in her area, she will plan to discuss outpatient therapy with surgeon in next 2 weeks.  She discussed being active with chronic condition health coach program with Active health management . Alyssa Hopkins  She says she has hospital indemnity and has made contact regarding claim.  She uses a [facility] pharmacy, [facility]     Objective:  Mrs. was hospitalized at [facility]  from 2/16-2/17/21  for  Right total hip replacement Comorbidities include: Hypertension , Arthritis  She was discharged to home on 2/17/21without the need for home health services, received rolling walker at discharge.    Assessment:  Patient voices good understanding of all discharge instructions.  See transition of care flowsheet for assessment details.   Plan:  Reviewed hospital discharge diagnosis of Right hip Arthroplasty anterior approach   and discharge treatment plan using hospital discharge instructions, assessing medication adherence, reviewing problems  requiring provider notification, and discussing the importance of follow up with surgeon, primary care provider  as directed. Reviewed [place] healthy lifestyle program information to receive discounted premium for  2022  Step 1: Get annual physical between February 05, 2018 and August 06, 2019; Step 2: Complete your health assessment between February 06, 2019 and October 07, 2019 at [email or web address] Step 3:Identify your current health status and complete the corresponding action step between January 1, and October 07, 2019.   Using [place] website, verified that patient is an active participate in [facility] [employer] chronic disease management program.    No ongoing care management needs identified so will close case to [place] Management services and route successful outreach letter with [place] Management pamphlet and 24 Hour Nurse Line Magnet to [place] Management clinical pool to be mailed to patient's home address.  Thanked patient for their services to [facility]  Joylene Draft, RN, BSN  [place] Management Coordinator  [phone]- Mobile [phone]- Toll Free Main Office

## 2019-04-07 ENCOUNTER — Ambulatory Visit (INDEPENDENT_AMBULATORY_CARE_PROVIDER_SITE_OTHER): Payer: 59 | Admitting: Orthopaedic Surgery

## 2019-04-07 ENCOUNTER — Other Ambulatory Visit: Payer: Self-pay

## 2019-04-07 ENCOUNTER — Encounter: Payer: Self-pay | Admitting: Orthopaedic Surgery

## 2019-04-07 DIAGNOSIS — Z96641 Presence of right artificial hip joint: Secondary | ICD-10-CM

## 2019-04-07 MED ORDER — METHOCARBAMOL 500 MG PO TABS: 500.0000 mg | ORAL_TABLET | Freq: Four times a day (QID) | ORAL | 1 refills | Status: AC | PRN

## 2019-04-07 MED FILL — METHOCARBAMOL 500 MG TABS: 500 | 10 days supply | Qty: 40 | Fill #0

## 2019-04-07 NOTE — Progress Notes (Signed)
HPI: Alyssa Hopkins comes in today 2 weeks status post right total hip arthroplasty.  She is overall doing well.  She has pain at times about the hip.  She is ambulating with a cane.  She denies any shortness of breath fevers or chills.'s been taking aspirin 81 mg every 6 hours.  Physical exam:  General well-developed well-nourished female no acute distress mood affect Psych: Alert and oriented x3 Right hip: Fluid motion, limited internal and external rotation.  Surgical incisions healing well approximated staples no signs of infection.  Right calf supple nontender.  Dorsiflexion plantarflexion right ankle intact.  Ambulates with a cane with a slight antalgic gait.  Impression: Status post right total hip arthroplasty 03/24/2019  Plan: Staples removed Steri-Strips applied.  She will work on scar tissue mobilization.  Continue to work on range of motion and strengthening of the hip.  She will begin taking 81 mg aspirin once daily for another week and then discontinue as she was on no aspirin prior to surgery.  Questions were encouraged and answered at length.  Refill on Robaxin was given today.  She does not ask for any pain medications and states that she has been off pain medications for the past 5 days.  She is having some problems with his insomnia, if this continues would like her to follow-up with her primary care physician to discuss treatment.  Did discuss with her good sleep hygiene.  We will see her back in 2 weeks at her request to fill out paperwork for return to work.

## 2019-04-13 MED FILL — LOSARTAN POTASSIUM 50 MG TA: 50 | 30 days supply | Qty: 60 | Fill #8

## 2019-04-28 ENCOUNTER — Encounter: Payer: Self-pay | Admitting: Orthopaedic Surgery

## 2019-04-28 ENCOUNTER — Ambulatory Visit (INDEPENDENT_AMBULATORY_CARE_PROVIDER_SITE_OTHER): Payer: 59 | Admitting: Orthopaedic Surgery

## 2019-04-28 ENCOUNTER — Other Ambulatory Visit: Payer: Self-pay

## 2019-04-28 DIAGNOSIS — Z96641 Presence of right artificial hip joint: Secondary | ICD-10-CM

## 2019-04-28 NOTE — Progress Notes (Signed)
The patient is a 64 year old female who is now 5 weeks status post right total hip arthroplasty.  She states she was doing well until she increased her activity significantly yesterday.  She got and about over 10,000 steps and even walking her dog was pulled with a walker dog.  Since then she does have an increasing groin pain on her right operative side.  She said her sleep is improving.  She has been on Tylenol as needed.  On exam she is walking without assistive device.  She does have a slight limp.  I can put her right hip through internal/external rotation with just some mild pain.  I have encouraged her to try Aleve or Advil as an anti-inflammatory twice daily for the next few days.  She will increase her activities or decrease her activities as comfort allows.  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  She is scheduled to return to work on 05/07/2019.  We will have her not see any face-to-face sick or ill patients until 06/29/2019.  This is while she continues to recover from surgery.  We will see her back in 4 weeks to see how she is doing overall.  No x-rays are needed unless she is having worsening pain.  All question concerns were answered and addressed.

## 2019-05-11 MED FILL — LOSARTAN POTASSIUM 50 MG TA: 50 | 30 days supply | Qty: 60 | Fill #9

## 2019-05-11 MED FILL — EZETIMIBE 10 MG TABS: 10 | 90 days supply | Qty: 90 | Fill #3

## 2019-05-25 ENCOUNTER — Ambulatory Visit (INDEPENDENT_AMBULATORY_CARE_PROVIDER_SITE_OTHER): Payer: 59 | Admitting: Orthopaedic Surgery

## 2019-05-25 ENCOUNTER — Other Ambulatory Visit: Payer: Self-pay

## 2019-05-25 ENCOUNTER — Encounter: Payer: Self-pay | Admitting: Orthopaedic Surgery

## 2019-05-25 DIAGNOSIS — Z96641 Presence of right artificial hip joint: Secondary | ICD-10-CM

## 2019-05-25 NOTE — Progress Notes (Signed)
The patient comes in today now almost 9-week status post a right total hip arthroplasty.  She says she is gradually improving overall and has been working on her range of motion and strength.  She is ready to do resistance band training if that is okay from my standpoint.  She is walking with a more balanced gait.  Examination of her right hip shows that it moves smoothly and fluidly.  Her leg lengths are improved.  At this point she will continue increase her activities as she tolerates.  There is no restrictions for her.  I would like to see her back in 6 months with a standing low AP pelvis and lateral of the right operative hip.  All questions and concerns were answered and addressed.

## 2019-06-08 MED FILL — LOSARTAN POTASSIUM 50 MG TA: 50 | 90 days supply | Qty: 180 | Fill #0

## 2019-06-29 ENCOUNTER — Encounter: Payer: Self-pay | Admitting: Orthopaedic Surgery

## 2019-07-16 DIAGNOSIS — Z0000 Encounter for general adult medical examination without abnormal findings: Secondary | ICD-10-CM | POA: Diagnosis not present

## 2019-07-16 DIAGNOSIS — Z6827 Body mass index (BMI) 27.0-27.9, adult: Secondary | ICD-10-CM | POA: Diagnosis not present

## 2019-07-16 DIAGNOSIS — Z23 Encounter for immunization: Secondary | ICD-10-CM | POA: Diagnosis not present

## 2019-07-16 DIAGNOSIS — R739 Hyperglycemia, unspecified: Secondary | ICD-10-CM | POA: Diagnosis not present

## 2019-07-16 DIAGNOSIS — Z1159 Encounter for screening for other viral diseases: Secondary | ICD-10-CM | POA: Diagnosis not present

## 2019-07-20 ENCOUNTER — Encounter: Payer: Self-pay | Admitting: Orthopaedic Surgery

## 2019-07-31 ENCOUNTER — Other Ambulatory Visit: Payer: Self-pay

## 2019-07-31 DIAGNOSIS — R739 Hyperglycemia, unspecified: Secondary | ICD-10-CM

## 2019-07-31 DIAGNOSIS — Z1159 Encounter for screening for other viral diseases: Secondary | ICD-10-CM | POA: Diagnosis not present

## 2019-08-03 LAB — HEPATITIS C ANTIBODY
Hepatitis C Ab: NONREACTIVE
SIGNAL TO CUT-OFF: 0.01 (ref <1.00)

## 2019-08-03 LAB — HEMOGLOBIN A1C
Hgb A1c MFr Bld: 5.8 % of total Hgb — ABNORMAL HIGH (ref <5.7)
Mean Plasma Glucose: 120 (calc)
eAG (mmol/L): 6.6 (calc)

## 2019-08-10 MED FILL — EZETIMIBE 10 MG TABS: 10 | 90 days supply | Qty: 90 | Fill #0

## 2019-09-07 MED FILL — LOSARTAN POTASSIUM 50 MG TA: 50 | 90 days supply | Qty: 180 | Fill #0

## 2019-09-07 MED FILL — METFORMIN HCL 500 MG TABS: 500 | 90 days supply | Qty: 180 | Fill #0

## 2019-11-10 ENCOUNTER — Other Ambulatory Visit (HOSPITAL_COMMUNITY): Payer: Self-pay | Admitting: Family Medicine

## 2019-11-10 MED FILL — EZETIMIBE 10 MG TABS: 10 | 90 days supply | Qty: 90 | Fill #0

## 2019-11-23 ENCOUNTER — Encounter: Payer: Self-pay | Admitting: Orthopaedic Surgery

## 2019-11-23 ENCOUNTER — Ambulatory Visit (INDEPENDENT_AMBULATORY_CARE_PROVIDER_SITE_OTHER): Payer: 59 | Admitting: Orthopaedic Surgery

## 2019-11-23 ENCOUNTER — Ambulatory Visit: Payer: Self-pay

## 2019-11-23 DIAGNOSIS — Z96641 Presence of right artificial hip joint: Secondary | ICD-10-CM | POA: Diagnosis not present

## 2019-11-23 NOTE — Progress Notes (Signed)
The patient is now 8 months status post a right total hip arthroplasty through direct anterior approach. She is walking every day and has no pain with her hip. She states she is doing well. She has a little bit of low back pain to the right side and SI joint pain. Her only complaint is still some stiffness in that hip on the right side. She says it feels much different and better than the pain she was dealing with preoperative.  Examination of her right hip shows it moves smoothly and fluidly as does her left hip.  X-rays of the right hip and pelvis show well-seated total hip arthroplasty with no complicating features. There is bone ingrowth.  At this point follow-up can be as needed since she is doing well. If she has any issues at all she knows to let us know. All questions and concerns were answered and addressed.

## 2019-11-26 ENCOUNTER — Ambulatory Visit (INDEPENDENT_AMBULATORY_CARE_PROVIDER_SITE_OTHER): Payer: 59

## 2019-11-26 ENCOUNTER — Other Ambulatory Visit: Payer: Self-pay

## 2019-11-26 DIAGNOSIS — Z23 Encounter for immunization: Secondary | ICD-10-CM | POA: Diagnosis not present

## 2019-12-07 ENCOUNTER — Other Ambulatory Visit (HOSPITAL_COMMUNITY): Payer: Self-pay | Admitting: Family Medicine

## 2019-12-07 MED FILL — METFORMIN HCL 500 MG TABS: 500 | 90 days supply | Qty: 180 | Fill #0

## 2019-12-07 MED FILL — LOSARTAN POTASSIUM 50 MG TA: 50 | 90 days supply | Qty: 180 | Fill #0

## 2020-02-08 MED FILL — EZETIMIBE 10 MG TABS: 10 | 90 days supply | Qty: 90 | Fill #1

## 2020-03-02 MED FILL — METFORMIN HCL 500 MG TABS: 500 | 90 days supply | Qty: 180 | Fill #1

## 2020-03-02 MED FILL — LOSARTAN POTASSIUM 50 MG TA: 50 | 30 days supply | Qty: 60 | Fill #1

## 2020-03-21 DIAGNOSIS — Z1231 Encounter for screening mammogram for malignant neoplasm of breast: Secondary | ICD-10-CM | POA: Diagnosis not present

## 2020-03-21 DIAGNOSIS — Z1239 Encounter for other screening for malignant neoplasm of breast: Secondary | ICD-10-CM | POA: Diagnosis not present

## 2020-04-13 DIAGNOSIS — Z853 Personal history of malignant neoplasm of breast: Secondary | ICD-10-CM | POA: Diagnosis not present

## 2020-04-13 DIAGNOSIS — R928 Other abnormal and inconclusive findings on diagnostic imaging of breast: Secondary | ICD-10-CM | POA: Diagnosis not present

## 2020-04-27 MED FILL — LOSARTAN POTASSIUM 50 MG TA: 50 | 30 days supply | Qty: 60 | Fill #2

## 2020-05-04 DIAGNOSIS — R928 Other abnormal and inconclusive findings on diagnostic imaging of breast: Secondary | ICD-10-CM | POA: Diagnosis not present

## 2020-05-04 DIAGNOSIS — L748 Other eccrine sweat disorders: Secondary | ICD-10-CM | POA: Diagnosis not present

## 2020-05-04 DIAGNOSIS — N6081 Other benign mammary dysplasias of right breast: Secondary | ICD-10-CM | POA: Diagnosis not present

## 2020-05-04 DIAGNOSIS — Z803 Family history of malignant neoplasm of breast: Secondary | ICD-10-CM | POA: Diagnosis not present

## 2020-05-04 DIAGNOSIS — N6311 Unspecified lump in the right breast, upper outer quadrant: Secondary | ICD-10-CM | POA: Diagnosis not present

## 2020-05-09 ENCOUNTER — Other Ambulatory Visit (HOSPITAL_COMMUNITY): Payer: Self-pay

## 2020-05-09 MED ORDER — EZETIMIBE 10 MG PO TABS
10.0000 mg | ORAL_TABLET | Freq: Every day | ORAL | 1 refills | Status: DC
  Filled 2020-05-09: qty 90, 90d supply, fill #0

## 2020-05-09 MED ORDER — METFORMIN HCL 500 MG PO TABS
500.0000 mg | ORAL_TABLET | Freq: Two times a day (BID) | ORAL | 1 refills | Status: DC
  Filled 2020-05-09 – 2021-04-17 (×2): qty 180, 90d supply, fill #0

## 2020-05-09 MED ORDER — LOSARTAN POTASSIUM 50 MG PO TABS
100.0000 mg | ORAL_TABLET | Freq: Every day | ORAL | 1 refills | Status: DC
  Filled 2020-05-09: qty 60, 30d supply, fill #0

## 2020-05-10 ENCOUNTER — Other Ambulatory Visit (HOSPITAL_COMMUNITY): Payer: Self-pay

## 2020-05-25 ENCOUNTER — Other Ambulatory Visit (HOSPITAL_COMMUNITY): Payer: Self-pay

## 2020-05-25 MED FILL — Losartan Potassium Tab 50 MG: ORAL | 90 days supply | Qty: 180 | Fill #0 | Status: AC

## 2020-06-03 ENCOUNTER — Other Ambulatory Visit (HOSPITAL_COMMUNITY): Payer: Self-pay

## 2020-06-03 MED FILL — Metformin HCl Tab 500 MG: ORAL | 90 days supply | Qty: 180 | Fill #0 | Status: AC

## 2020-07-10 IMAGING — RF DG C-ARM 1-60 MIN
1 series · 2 of 2 positions shown · non-contrast
Comparison: Plain films the right hip 11/10/2018.

CLINICAL DATA: Intraoperative imaging for right hip replacement.

EXAM:
DG C-ARM 1-60 MIN; OPERATIVE RIGHT HIP WITH PELVIS

[Series 1: unknown protocol · 0.20mm/px · 2 of 2 slices shown]
[im 1/2]
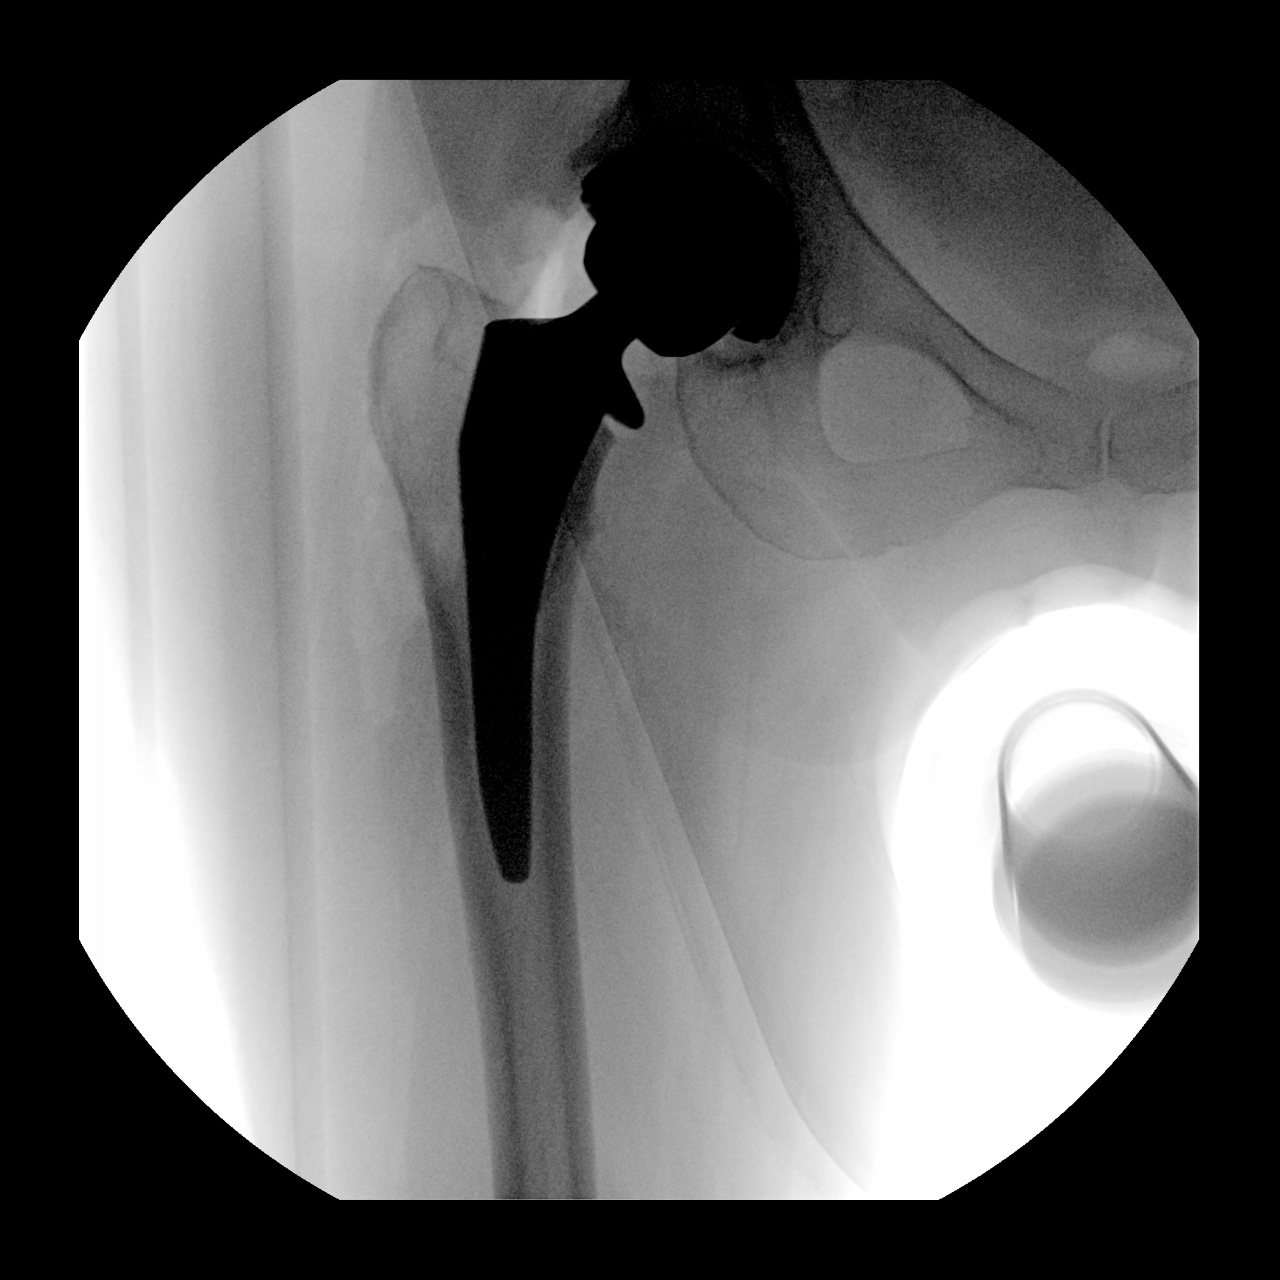
[im 2/2]
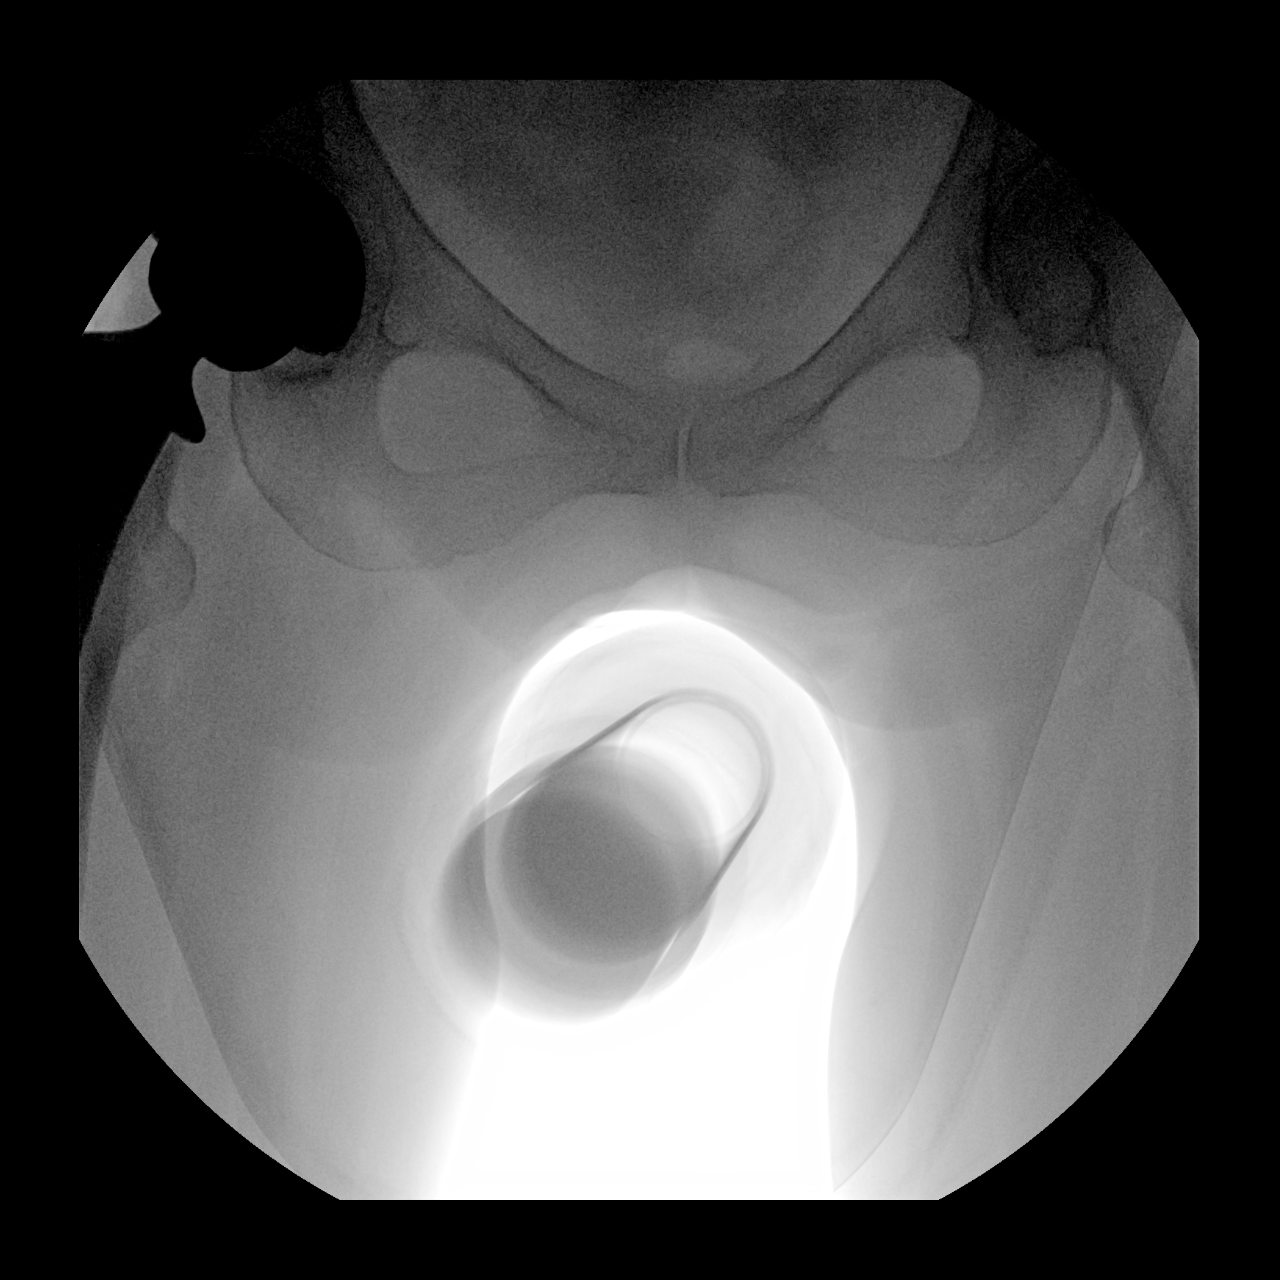

[2 of 2 positions shown; findings below may reference images not displayed]

FINDINGS: Two intraoperative fluoroscopic spot views are provided and include
the right hip and low pelvis. Right total hip arthroplasty is in
place. No acute abnormality is identified.
IMPRESSION: Intraoperative imaging for right hip replacement.  No acute finding.

## 2020-07-20 ENCOUNTER — Other Ambulatory Visit (HOSPITAL_COMMUNITY): Payer: Self-pay

## 2020-07-20 DIAGNOSIS — I10 Essential (primary) hypertension: Secondary | ICD-10-CM | POA: Diagnosis not present

## 2020-07-20 DIAGNOSIS — Z0000 Encounter for general adult medical examination without abnormal findings: Secondary | ICD-10-CM | POA: Diagnosis not present

## 2020-07-20 DIAGNOSIS — L678 Other hair color and hair shaft abnormalities: Secondary | ICD-10-CM | POA: Diagnosis not present

## 2020-07-20 DIAGNOSIS — Z23 Encounter for immunization: Secondary | ICD-10-CM | POA: Diagnosis not present

## 2020-07-20 DIAGNOSIS — E7800 Pure hypercholesterolemia, unspecified: Secondary | ICD-10-CM | POA: Diagnosis not present

## 2020-07-20 DIAGNOSIS — R87615 Unsatisfactory cytologic smear of cervix: Secondary | ICD-10-CM | POA: Diagnosis not present

## 2020-07-20 DIAGNOSIS — Z6828 Body mass index (BMI) 28.0-28.9, adult: Secondary | ICD-10-CM | POA: Diagnosis not present

## 2020-07-20 MED ORDER — METFORMIN HCL 500 MG PO TABS
500.0000 mg | ORAL_TABLET | Freq: Two times a day (BID) | ORAL | 3 refills | Status: AC
  Filled 2020-07-20: qty 180, 90d supply, fill #0

## 2020-07-20 MED ORDER — LOSARTAN POTASSIUM 100 MG PO TABS
100.0000 mg | ORAL_TABLET | Freq: Every morning | ORAL | 3 refills | Status: DC
  Filled 2020-07-20: qty 90, 90d supply, fill #0
  Filled 2020-10-17: qty 90, 90d supply, fill #1
  Filled 2021-01-12: qty 90, 90d supply, fill #2
  Filled 2021-04-09: qty 90, 90d supply, fill #3

## 2020-07-20 MED ORDER — EZETIMIBE 10 MG PO TABS
10.0000 mg | ORAL_TABLET | Freq: Every morning | ORAL | 3 refills | Status: DC
  Filled 2020-07-20: qty 90, 90d supply, fill #0
  Filled 2020-10-17: qty 90, 90d supply, fill #1
  Filled 2021-01-12: qty 90, 90d supply, fill #2
  Filled 2021-04-09: qty 90, 90d supply, fill #3

## 2020-08-17 DIAGNOSIS — B078 Other viral warts: Secondary | ICD-10-CM | POA: Diagnosis not present

## 2020-08-17 DIAGNOSIS — D485 Neoplasm of uncertain behavior of skin: Secondary | ICD-10-CM | POA: Diagnosis not present

## 2020-10-17 ENCOUNTER — Other Ambulatory Visit (HOSPITAL_COMMUNITY): Payer: Self-pay

## 2020-10-18 ENCOUNTER — Other Ambulatory Visit (HOSPITAL_COMMUNITY): Payer: Self-pay

## 2020-11-09 DIAGNOSIS — N951 Menopausal and female climacteric states: Secondary | ICD-10-CM | POA: Diagnosis not present

## 2020-11-09 DIAGNOSIS — Z780 Asymptomatic menopausal state: Secondary | ICD-10-CM | POA: Diagnosis not present

## 2020-11-30 DIAGNOSIS — R922 Inconclusive mammogram: Secondary | ICD-10-CM | POA: Diagnosis not present

## 2020-11-30 DIAGNOSIS — R928 Other abnormal and inconclusive findings on diagnostic imaging of breast: Secondary | ICD-10-CM | POA: Diagnosis not present

## 2021-01-13 ENCOUNTER — Other Ambulatory Visit (HOSPITAL_COMMUNITY): Payer: Self-pay

## 2021-04-09 ENCOUNTER — Other Ambulatory Visit (HOSPITAL_COMMUNITY): Payer: Self-pay

## 2021-04-10 ENCOUNTER — Other Ambulatory Visit (HOSPITAL_COMMUNITY): Payer: Self-pay

## 2021-04-11 ENCOUNTER — Other Ambulatory Visit (HOSPITAL_COMMUNITY): Payer: Self-pay

## 2021-04-17 ENCOUNTER — Other Ambulatory Visit (HOSPITAL_COMMUNITY): Payer: Self-pay

## 2021-05-24 ENCOUNTER — Other Ambulatory Visit (HOSPITAL_COMMUNITY): Payer: Self-pay

## 2021-05-24 DIAGNOSIS — E785 Hyperlipidemia, unspecified: Secondary | ICD-10-CM | POA: Diagnosis not present

## 2021-05-24 DIAGNOSIS — E7800 Pure hypercholesterolemia, unspecified: Secondary | ICD-10-CM | POA: Diagnosis not present

## 2021-05-24 DIAGNOSIS — I10 Essential (primary) hypertension: Secondary | ICD-10-CM | POA: Diagnosis not present

## 2021-05-24 DIAGNOSIS — R635 Abnormal weight gain: Secondary | ICD-10-CM | POA: Diagnosis not present

## 2021-05-24 DIAGNOSIS — K219 Gastro-esophageal reflux disease without esophagitis: Secondary | ICD-10-CM | POA: Diagnosis not present

## 2021-05-24 MED ORDER — LOSARTAN POTASSIUM 100 MG PO TABS
100.0000 mg | ORAL_TABLET | Freq: Every day | ORAL | 3 refills | Status: AC
  Filled 2021-07-10: qty 90, 90d supply, fill #0

## 2021-05-24 MED ORDER — EZETIMIBE 10 MG PO TABS
10.0000 mg | ORAL_TABLET | Freq: Every day | ORAL | 3 refills | Status: AC
  Filled 2021-07-10: qty 90, 90d supply, fill #0

## 2021-05-24 MED ORDER — PHENTERMINE HCL 37.5 MG PO CAPS
37.5000 mg | ORAL_CAPSULE | Freq: Every morning | ORAL | 0 refills | Status: DC
  Filled 2021-05-24: qty 90, 90d supply, fill #0

## 2021-05-25 ENCOUNTER — Other Ambulatory Visit (HOSPITAL_COMMUNITY): Payer: Self-pay

## 2021-06-09 ENCOUNTER — Other Ambulatory Visit (HOSPITAL_COMMUNITY): Payer: Self-pay

## 2021-06-09 MED ORDER — PHENTERMINE HCL 15 MG PO CAPS
15.0000 mg | ORAL_CAPSULE | Freq: Every morning | ORAL | 0 refills | Status: DC
  Filled 2021-06-09: qty 90, 90d supply, fill #0

## 2021-07-10 ENCOUNTER — Other Ambulatory Visit (HOSPITAL_COMMUNITY): Payer: Self-pay

## 2021-07-13 ENCOUNTER — Other Ambulatory Visit (HOSPITAL_COMMUNITY): Payer: Self-pay

## 2021-07-14 ENCOUNTER — Other Ambulatory Visit (HOSPITAL_COMMUNITY): Payer: Self-pay

## 2021-07-14 MED ORDER — METFORMIN HCL 500 MG PO TABS
500.0000 mg | ORAL_TABLET | Freq: Two times a day (BID) | ORAL | 1 refills | Status: AC
  Filled 2021-07-14: qty 180, 90d supply, fill #0

## 2021-07-21 ENCOUNTER — Other Ambulatory Visit (HOSPITAL_COMMUNITY): Payer: Self-pay

## 2021-07-21 DIAGNOSIS — Z6828 Body mass index (BMI) 28.0-28.9, adult: Secondary | ICD-10-CM | POA: Diagnosis not present

## 2021-07-21 DIAGNOSIS — Z0000 Encounter for general adult medical examination without abnormal findings: Secondary | ICD-10-CM | POA: Diagnosis not present

## 2021-07-21 DIAGNOSIS — I10 Essential (primary) hypertension: Secondary | ICD-10-CM | POA: Diagnosis not present

## 2021-07-21 DIAGNOSIS — E7800 Pure hypercholesterolemia, unspecified: Secondary | ICD-10-CM | POA: Diagnosis not present

## 2021-07-21 MED ORDER — PHENTERMINE HCL 15 MG PO CAPS
30.0000 mg | ORAL_CAPSULE | Freq: Every morning | ORAL | 0 refills | Status: DC
  Filled 2021-07-21 – 2021-08-07 (×4): qty 180, 90d supply, fill #0

## 2021-07-31 ENCOUNTER — Other Ambulatory Visit (HOSPITAL_COMMUNITY): Payer: Self-pay

## 2021-08-01 ENCOUNTER — Other Ambulatory Visit (HOSPITAL_COMMUNITY): Payer: Self-pay

## 2021-08-02 ENCOUNTER — Other Ambulatory Visit (HOSPITAL_COMMUNITY): Payer: Self-pay

## 2021-08-07 ENCOUNTER — Other Ambulatory Visit (HOSPITAL_COMMUNITY): Payer: Self-pay

## 2021-08-09 ENCOUNTER — Other Ambulatory Visit (HOSPITAL_COMMUNITY): Payer: Self-pay

## 2021-08-09 MED ORDER — PHENTERMINE HCL 30 MG PO CAPS
30.0000 mg | ORAL_CAPSULE | Freq: Every morning | ORAL | 0 refills | Status: AC
  Filled 2021-08-09 – 2021-08-10 (×2): qty 90, 90d supply, fill #0

## 2021-08-10 ENCOUNTER — Other Ambulatory Visit (HOSPITAL_COMMUNITY): Payer: Self-pay
